# Patient Record
Sex: Male | Born: 1985 | Race: White | Hispanic: No | Marital: Single | Smoking: Current some day smoker
Health system: Southern US, Community
[De-identification: ages and names within clinical notes are randomized; demographics above are authoritative.]

## PROBLEM LIST (undated history)

## (undated) DIAGNOSIS — F1910 Other psychoactive substance abuse, uncomplicated: Secondary | ICD-10-CM

## (undated) DIAGNOSIS — F419 Anxiety disorder, unspecified: Secondary | ICD-10-CM

## (undated) DIAGNOSIS — I10 Essential (primary) hypertension: Secondary | ICD-10-CM

## (undated) HISTORY — PX: WISDOM TOOTH EXTRACTION: SHX21

## (undated) HISTORY — DX: Essential (primary) hypertension: I10

## (undated) HISTORY — DX: Anxiety disorder, unspecified: F41.9

## (undated) HISTORY — DX: Other psychoactive substance abuse, uncomplicated: F19.10

---

## 2004-08-02 ENCOUNTER — Ambulatory Visit: Payer: Self-pay | Admitting: Psychiatry

## 2010-03-24 ENCOUNTER — Emergency Department (HOSPITAL_COMMUNITY): Admission: EM | Admit: 2010-03-24 | Discharge: 2010-03-24 | Payer: Self-pay | Admitting: Emergency Medicine

## 2011-04-12 ENCOUNTER — Emergency Department (HOSPITAL_COMMUNITY)
Admission: EM | Admit: 2011-04-12 | Discharge: 2011-04-13 | Disposition: A | Discharge status: Home / Self Care | Payer: BC Managed Care – PPO | Attending: Emergency Medicine | Admitting: Emergency Medicine

## 2011-04-12 DIAGNOSIS — K089 Disorder of teeth and supporting structures, unspecified: Secondary | ICD-10-CM | POA: Insufficient documentation

## 2011-04-12 DIAGNOSIS — K047 Periapical abscess without sinus: Secondary | ICD-10-CM | POA: Insufficient documentation

## 2020-04-15 ENCOUNTER — Ambulatory Visit
Admission: EM | Admit: 2020-04-15 | Discharge: 2020-04-15 | Disposition: A | Discharge status: Home / Self Care | Payer: Self-pay

## 2020-04-15 ENCOUNTER — Other Ambulatory Visit: Payer: Self-pay

## 2020-04-15 DIAGNOSIS — H938X3 Other specified disorders of ear, bilateral: Secondary | ICD-10-CM

## 2020-04-15 DIAGNOSIS — H60502 Unspecified acute noninfective otitis externa, left ear: Secondary | ICD-10-CM

## 2020-04-15 DIAGNOSIS — H9203 Otalgia, bilateral: Secondary | ICD-10-CM

## 2020-04-15 DIAGNOSIS — H9313 Tinnitus, bilateral: Secondary | ICD-10-CM

## 2020-04-15 MED ORDER — PREDNISONE 10 MG (21) PO TBPK
ORAL_TABLET | Freq: Every day | ORAL | 0 refills | Status: DC
Start: 2020-04-15 — End: 2020-05-23

## 2020-04-15 MED ORDER — CIPROFLOXACIN-DEXAMETHASONE 0.3-0.1 % OT SUSP: 4.0000 [drp] | Freq: Two times a day (BID) | OTIC | 0 refills | Status: DC

## 2020-04-15 MED ORDER — NEOMYCIN-POLYMYXIN-HC 3.5-10000-1 OT SUSP: 4.0000 [drp] | Freq: Three times a day (TID) | OTIC | 0 refills | Status: AC

## 2020-04-15 NOTE — ED Provider Notes (Signed)
[place]   [id number] 04/15/20 Arrival Time: 1301  CC: EAR PAIN  SUBJECTIVE: History from: patient.  Christopher Castro is a 34 y.o. male who presents with of bilateral tinnitus, ear pressure, and mild pain 6 months.  Denies a precipitating event, or trauma.  Patient reports intermittent pressure, and constant ringing.  Went to UC when symptoms began, patient has tried meclizine without relief.  Symptoms are made worse in the morning.  Denies similar symptoms in the past.  Complains of associated fatigue, and nausea.  Denies fever, chills, sinus pain, rhinorrhea, ear discharge, sore throat, cough, SOB, wheezing, chest pain, nausea, changes in bowel or bladder habits.    Patient also mentions LUQ x 7 months.  Recently went to ED and had full work-up done.  Blood work and CT were negative.  Patient instructed to follow up with PCP, has not done so yet.  Would like to be seen for ear symptoms.    ROS: As per HPI.  All other pertinent ROS negative.     History reviewed. No pertinent past medical history. History reviewed. No pertinent surgical history. No Known Allergies No current facility-administered medications on file prior to encounter.   Current Outpatient Medications on File Prior to Encounter  Medication Sig Dispense Refill  . buprenorphine (SUBUTEX) 2 MG SUBL SL tablet Place 8 mg under the tongue daily.     Social History   Socioeconomic History  . Marital status: Single    Spouse name: Not on file  . Number of children: Not on file  . Years of education: Not on file  . Highest education level: Not on file  Occupational History  . Not on file  Tobacco Use  . Smoking status: Current Every Day Smoker    Packs/day: 0.50    Types: Cigarettes  . Smokeless tobacco: Never Used  Substance and Sexual Activity  . Alcohol use: Not Currently  . Drug use: Not Currently  . Sexual activity: Not on file  Other Topics Concern  . Not on file  Social History Narrative  .  Not on file   Social Determinants of Health   Financial Resource Strain:   . Difficulty of Paying Living Expenses:   Food Insecurity:   . Worried About Charity fundraiser in the Last Year:   . Arboriculturist in the Last Year:   Transportation Needs:   . Film/video editor (Medical):   Marland Kitchen Lack of Transportation (Non-Medical):   Physical Activity:   . Days of Exercise per Week:   . Minutes of Exercise per Session:   Stress:   . Feeling of Stress :   Social Connections:   . Frequency of Communication with Friends and Family:   . Frequency of Social Gatherings with Friends and Family:   . Attends Religious Services:   . Active Member of Clubs or Organizations:   . Attends Archivist Meetings:   Marland Kitchen Marital Status:   Intimate Partner Violence:   . Fear of Current or Ex-Partner:   . Emotionally Abused:   Marland Kitchen Physically Abused:   . Sexually Abused:    History reviewed. No pertinent family history.  OBJECTIVE:  Vitals:   04/15/20 1317  BP: (!) 143/89  Pulse: 73  Resp: 18  Temp: 98.6 F (37 C)  SpO2: 96%     General appearance: alert; well-appearing, nontoxic; speaking in full sentences and tolerating own secretions HEENT: NCAT; Ears: EAC clear, LT EAC mildly erythematous, TMs pearly  gray; Eyes: PERRL.  EOM grossly intact.Nose: nares patent without rhinorrhea, Throat: oropharynx clear, tonsils non erythematous or enlarged, uvula midline  Neck: supple without LAD Lungs: unlabored respirations, symmetrical air entry; cough: absent; no respiratory distress; CTAB Heart: regular rate and rhythm.  Skin: warm and dry Psychological: alert and cooperative; normal mood and affect   ASSESSMENT & PLAN:  1. Ringing in ear, bilateral   2. Pressure sensation in both ears   3. Ear discomfort, bilateral   4. Acute otitis externa of left ear, unspecified type     Meds ordered this encounter  Medications  . ciprofloxacin-dexamethasone (CIPRODEX) OTIC suspension    Sig:  Place 4 drops into the left ear 2 (two) times daily for 7 days.    Dispense:  7.5 mL    Refill:  0    Order Specific Question:   Supervising Provider    Answer:   Eustace Moore [[id number]]  . predniSONE (STERAPRED UNI-PAK 21 TAB) 10 MG (21) TBPK tablet    Sig: Take by mouth daily. Take 6 tabs by mouth daily  for 2 days, then 5 tabs for 2 days, then 4 tabs for 2 days, then 3 tabs for 2 days, 2 tabs for 2 days, then 1 tab by mouth daily for 2 days    Dispense:  42 tablet    Refill:  0    Order Specific Question:   Supervising Provider    Answer:   Eustace Moore [[id number]]    Rest and drink plenty of fluids Ciprodex ear drops prescribed.  Take as directed and to completion We will trial a course of steroids as well for the ringing in your ears Follow up with PCP for further evaluation and management Return here or go to the ER if you have any new or worsening symptoms fever, chills, nausea, vomiting, redness, swelling, discharge, pain, worsening symptoms despite treatment, etc...  Reviewed expectations re: course of current medical issues. Questions answered. Outlined signs and symptoms indicating need for more acute intervention. Patient verbalized understanding. After Visit Summary given.         Rennis Harding, PA-C 04/15/20 1521

## 2020-04-15 NOTE — Discharge Instructions (Addendum)
Rest and drink plenty of fluids Ciprodex ear drops prescribed.  Take as directed and to completion We will trial a course of steroids as well for the ringing in your ears Follow up with PCP for further evaluation and management Return here or go to the ER if you have any new or worsening symptoms fever, chills, nausea, vomiting, redness, swelling, discharge, pain, worsening symptoms despite treatment, etc..Marland Kitchen

## 2020-04-15 NOTE — ED Triage Notes (Signed)
Pt presents with c/o ear pressure and left upper quadrant pian. Pt states that last fall he developed dizziness and ears popping since then they have been ringing then 2 days ago pressure developed also has left upper quadrant pain and had CT for same last month and was negative but pain has increased

## 2020-05-16 ENCOUNTER — Other Ambulatory Visit: Payer: Self-pay

## 2020-05-16 DIAGNOSIS — Z139 Encounter for screening, unspecified: Secondary | ICD-10-CM

## 2020-05-16 LAB — GLUCOSE, POCT (MANUAL RESULT ENTRY): POC Glucose: 95 mg/dl (ref 70–99)

## 2020-05-16 NOTE — Congregational Nurse Program (Signed)
Pt attended [facility] to get established with a primary care provider and to to complete his enrollment for [facility] uninsured program.  Pt states his chief complaints are ringing in ears, intermittent headaches during morning hours, fatigue that has been on going since Nov 2020.   States he has been tested for covid 4 times, and symptoms have continue before and after testing.  States he has has intermittent pain in upper left abdomen area since Aug/Sept 2020.   States he recently went to ER for same symptoms described earlier, but they have not yet subsided.  No socio-determinant needs identified during visit outside of medical needs.    State he has a former substance abuse issue with opoids, but denies using in over 1 year or longer.  States [facility], where he obtain his med  (Subutex 8mg  daily mouth) on a weekly basis to help with the opiod dependency     Vital signs were checked (see vitals on record)  BP (ABN), Blood Glucose(random) an denies any previous hx or hyp (NORM)/Random).  States previous hx of elevated blood pressure but no prescribed meds.   BP/Smoking prevention/mangement was reviewed, in addition to guidelines of uninsured  program and when to use appropriate medical facilities based on medical needs.      Plan -Referred to [facility] Program for enrollment and eligibilty for uninsured..  Enrollment was completed by ) during visit  -Referral made on today for [facility]  and appointment was made for Tuesday, May 23, 2020 at 9:30am.

## 2020-05-23 ENCOUNTER — Other Ambulatory Visit: Payer: Self-pay

## 2020-05-23 ENCOUNTER — Telehealth: Payer: Self-pay | Admitting: Student

## 2020-05-23 ENCOUNTER — Ambulatory Visit: Payer: Self-pay | Admitting: Physician Assistant

## 2020-05-23 ENCOUNTER — Encounter: Payer: Self-pay | Admitting: Physician Assistant

## 2020-05-23 VITALS — BP 130/80 | HR 94 | Temp 97.7°F | Ht 72.0 in | Wt 214.5 lb

## 2020-05-23 DIAGNOSIS — R1012 Left upper quadrant pain: Secondary | ICD-10-CM

## 2020-05-23 DIAGNOSIS — R519 Headache, unspecified: Secondary | ICD-10-CM

## 2020-05-23 DIAGNOSIS — Z7689 Persons encountering health services in other specified circumstances: Secondary | ICD-10-CM

## 2020-05-23 DIAGNOSIS — J329 Chronic sinusitis, unspecified: Secondary | ICD-10-CM

## 2020-05-23 DIAGNOSIS — F1911 Other psychoactive substance abuse, in remission: Secondary | ICD-10-CM

## 2020-05-23 DIAGNOSIS — H9313 Tinnitus, bilateral: Secondary | ICD-10-CM

## 2020-05-23 DIAGNOSIS — Z131 Encounter for screening for diabetes mellitus: Secondary | ICD-10-CM

## 2020-05-23 DIAGNOSIS — Z13220 Encounter for screening for lipoid disorders: Secondary | ICD-10-CM

## 2020-05-23 MED ORDER — OMEPRAZOLE 40 MG PO CPDR: 40.0000 mg | DELAYED_RELEASE_CAPSULE | Freq: Every day | ORAL | 3 refills | Status: DC

## 2020-05-23 MED ORDER — AMOXICILLIN-POT CLAVULANATE 875-125 MG PO TABS
1.0000 | ORAL_TABLET | Freq: Two times a day (BID) | ORAL | 0 refills | Status: AC
Start: 2020-05-23 — End: 2020-06-06

## 2020-05-23 NOTE — Patient Instructions (Signed)
Gastroesophageal Reflux Disease, Adult Gastroesophageal reflux (GER) happens when acid from the stomach flows up into the tube that connects the mouth and the stomach (esophagus). Normally, food travels down the esophagus and stays in the stomach to be digested. However, when a person has GER, food and stomach acid sometimes move back up into the esophagus. If this becomes a more serious problem, the person may be diagnosed with a disease called gastroesophageal reflux disease (GERD). GERD occurs when the reflux:  Happens often.  Causes frequent or severe symptoms.  Causes problems such as damage to the esophagus. When stomach acid comes in contact with the esophagus, the acid may cause soreness (inflammation) in the esophagus. Over time, GERD may create small holes (ulcers) in the lining of the esophagus. What are the causes? This condition is caused by a problem with the muscle between the esophagus and the stomach (lower esophageal sphincter, or LES). Normally, the LES muscle closes after food passes through the esophagus to the stomach. When the LES is weakened or abnormal, it does not close properly, and that allows food and stomach acid to go back up into the esophagus. The LES can be weakened by certain dietary substances, medicines, and medical conditions, including:  Tobacco use.  Pregnancy.  Having a hiatal hernia.  Alcohol use.  Certain foods and beverages, such as coffee, chocolate, onions, and peppermint. What increases the risk? You are more likely to develop this condition if you:  Have an increased body weight.  Have a connective tissue disorder.  Use NSAID medicines. What are the signs or symptoms? Symptoms of this condition include:  Heartburn.  Difficult or painful swallowing.  The feeling of having a lump in the throat.  Abitter taste in the mouth.  Bad breath.  Having a large amount of saliva.  Having an upset or bloated  stomach.  Belching.  Chest pain. Different conditions can cause chest pain. Make sure you see your health care provider if you experience chest pain.  Shortness of breath or wheezing.  Ongoing (chronic) cough or a night-time cough.  Wearing away of tooth enamel.  Weight loss. How is this diagnosed? Your health care provider will take a medical history and perform a physical exam. To determine if you have mild or severe GERD, your health care provider may also monitor how you respond to treatment. You may also have tests, including:  A test to examine your stomach and esophagus with a small camera (endoscopy).  A test thatmeasures the acidity level in your esophagus.  A test thatmeasures how much pressure is on your esophagus.  A barium swallow or modified barium swallow test to show the shape, size, and functioning of your esophagus. How is this treated? The goal of treatment is to help relieve your symptoms and to prevent complications. Treatment for this condition may vary depending on how severe your symptoms are. Your health care provider may recommend:  Changes to your diet.  Medicine.  Surgery. Follow these instructions at home: Eating and drinking   Follow a diet as recommended by your health care provider. This may involve avoiding foods and drinks such as: ? Coffee and tea (with or without caffeine). ? Drinks that containalcohol. ? Energy drinks and sports drinks. ? Carbonated drinks or sodas. ? Chocolate and cocoa. ? Peppermint and mint flavorings. ? Garlic and onions. ? Horseradish. ? Spicy and acidic foods, including peppers, chili powder, curry powder, vinegar, hot sauces, and barbecue sauce. ? Citrus fruit juices and citrus   fruits, such as oranges, lemons, and limes. ? Tomato-based foods, such as red sauce, chili, salsa, and pizza with red sauce. ? Fried and fatty foods, such as donuts, french fries, potato chips, and high-fat dressings. ? High-fat  meats, such as hot dogs and fatty cuts of red and white meats, such as rib eye steak, sausage, ham, and bacon. ? High-fat dairy items, such as whole milk, butter, and cream cheese.  Eat small, frequent meals instead of large meals.  Avoid drinking large amounts of liquid with your meals.  Avoid eating meals during the 2-3 hours before bedtime.  Avoid lying down right after you eat.  Do not exercise right after you eat. Lifestyle   Do not use any products that contain nicotine or tobacco, such as cigarettes, e-cigarettes, and chewing tobacco. If you need help quitting, ask your health care provider.  Try to reduce your stress by using methods such as yoga or meditation. If you need help reducing stress, ask your health care provider.  If you are overweight, reduce your weight to an amount that is healthy for you. Ask your health care provider for guidance about a safe weight loss goal. General instructions  Pay attention to any changes in your symptoms.  Take over-the-counter and prescription medicines only as told by your health care provider. Do not take aspirin, ibuprofen, or other NSAIDs unless your health care provider told you to do so.  Wear loose-fitting clothing. Do not wear anything tight around your waist that causes pressure on your abdomen.  Raise (elevate) the head of your bed about 6 inches (15 cm).  Avoid bending over if this makes your symptoms worse.  Keep all follow-up visits as told by your health care provider. This is important. Contact a health care provider if:  You have: ? New symptoms. ? Unexplained weight loss. ? Difficulty swallowing or it hurts to swallow. ? Wheezing or a persistent cough. ? A hoarse voice.  Your symptoms do not improve with treatment. Get help right away if you:  Have pain in your arms, neck, jaw, teeth, or back.  Feel sweaty, dizzy, or light-headed.  Have chest pain or shortness of breath.  Vomit and your vomit looks  like blood or coffee grounds.  Faint.  Have stool that is bloody or black.  Cannot swallow, drink, or eat. Summary  Gastroesophageal reflux happens when acid from the stomach flows up into the esophagus. GERD is a disease in which the reflux happens often, causes frequent or severe symptoms, or causes problems such as damage to the esophagus.  Treatment for this condition may vary depending on how severe your symptoms are. Your health care provider may recommend diet and lifestyle changes, medicine, or surgery.  Contact a health care provider if you have new or worsening symptoms.  Take over-the-counter and prescription medicines only as told by your health care provider. Do not take aspirin, ibuprofen, or other NSAIDs unless your health care provider told you to do so.  Keep all follow-up visits as told by your health care provider. This is important. This information is not intended to replace advice given to you by your health care provider. Make sure you discuss any questions you have with your health care provider. Document Revised: 05/27/2018 Document Reviewed: 05/27/2018 Elsevier Patient Education  2020 Elsevier Inc.  

## 2020-05-23 NOTE — Telephone Encounter (Signed)
Pt called c/o nausea and vomiting after having taken his new rx medications augmentin and omeprazole. Pt states he took augmentin first and then became nauseous. Pt then took omeprazole and began vomiting. Pt reports having two vomiting episodes. Pt then while still on the phone with LPN began gagging and heaving (possibly vomiting). LPN advises pt to seek ER treatment as it may be an allergic reaction to the medications. Pt verbalized understanding.

## 2020-05-23 NOTE — Progress Notes (Signed)
BP 130/80   Pulse 94   Temp 97.7 F (36.5 C)   Ht 6' (1.829 m)   Wt 214 lb 8 oz (97.3 kg)   SpO2 96%   BMI 29.09 kg/m    Subjective:    Patient ID: Christopher Castro, male    DOB: Apr 14, 1986, 34 y.o.   MRN: [id number]  HPI: Harbert Fitterer is a 34 y.o. male presenting on 05/23/2020 for New Patient (Initial Visit) (no previous PCP), Tinnitus (with HA. pt believes its both ear which started Nov. 2020 after he was sick pt states he hear constant popping in his ears and ever since has had ringing in his ears. pt states he has had intermittent HA daily), and Abdominal Pain (since August/ Sept. 2020.)   HPI   Pt had a negative covid 19 screening questionnaire.   Pt is a 57yoM who presents to establish care.  He has had No PCP in a long time/years.  Pt is still taking subutex from [facility] for hx substance abuse.   He says he has been clean for years.    Pt works as a Oceanographer, mostly Equities trader.  Pt says he feels really really bad.  Some days headache pain.    He has not gottten the covid vaccination.    He has been Feeling bad since before christmas - waxing and waning but never going away.   Lots of head congestion comes and goes.  Head pain frontal .  Ears only sometimes hurt but just a little.  Ringing never goes away.  No dizziness.   abd pain started end of last summer.  Comes and goes.  LUQ.  No better or worse with eating.  No OTCs except APAP and IBU which he takes sometimes for HA and sometimes for abd pain.   He will usually take 1 apap and 2-4 IBU at one time-  Most days none-   Some days he takes multiple doses.  mOre for the head pain than the abd pain.   No emesis.  BMs normal with no blood.    No family history of colon cancer.    He had a abdominal scan of some type at [facility] ER but he says it was unhelpful.  He has been 2 or 3 times.  Most recent time was for abd pain when he had the scan of abd.  He says he never had scan of Head.   He says he was never  given medication to help his stomach pain.      Relevant past medical, surgical, family and social history reviewed and updated as indicated. Interim medical history since our last visit reviewed. Allergies and medications reviewed and updated.   Current Outpatient Medications:  .  Acetaminophen (TYLENOL PO), Take 1 tablet by mouth as needed., Disp: , Rfl:  .  buprenorphine (SUBUTEX) 8 MG SUBL SL tablet, Place under the tongue daily., Disp: , Rfl:  .  Ibuprofen (ADVIL PO), Take 1 tablet by mouth as needed., Disp: , Rfl:      Review of Systems  Per HPI unless specifically indicated above     Objective:    BP 130/80   Pulse 94   Temp 97.7 F (36.5 C)   Ht 6' (1.829 m)   Wt 214 lb 8 oz (97.3 kg)   SpO2 96%   BMI 29.09 kg/m   Wt Readings from Last 3 Encounters:  05/23/20 214 lb 8 oz (97.3 kg)  05/16/20 213 lb (96.6  kg)    Physical Exam Vitals reviewed.  Constitutional:      General: He is not in acute distress.    Appearance: He is well-developed.  HENT:     Head: Normocephalic and atraumatic.     Right Ear: Tympanic membrane and ear canal normal.     Left Ear: Tympanic membrane and ear canal normal.     Nose: Congestion and rhinorrhea present.     Mouth/Throat:     Pharynx: No oropharyngeal exudate or posterior oropharyngeal erythema.  Eyes:     Conjunctiva/sclera: Conjunctivae normal.     Pupils: Pupils are equal, round, and reactive to light.  Neck:     Thyroid: No thyromegaly.  Cardiovascular:     Rate and Rhythm: Normal rate and regular rhythm.  Pulmonary:     Effort: Pulmonary effort is normal.     Breath sounds: Normal breath sounds. No wheezing or rales.  Abdominal:     General: Bowel sounds are normal.     Palpations: Abdomen is soft. There is no mass.     Tenderness: There is abdominal tenderness in the epigastric area. There is no guarding or rebound.     Comments: Mild epigastric tenderness.  Genitourinary:    Testes:        Right: Swelling  not present.        Left: Swelling not present.  Musculoskeletal:     Cervical back: Neck supple.     Right lower leg: No edema.     Left lower leg: No edema.  Lymphadenopathy:     Cervical: No cervical adenopathy.  Skin:    General: Skin is warm and dry.     Findings: No rash.  Neurological:     Mental Status: He is alert and oriented to person, place, and time.     Cranial Nerves: No facial asymmetry.     Sensory: Sensation is intact.     Motor: No weakness.     Gait: Gait is intact. Gait normal.     Deep Tendon Reflexes:     Reflex Scores:      Patellar reflexes are 2+ on the right side and 2+ on the left side. Psychiatric:        Behavior: Behavior normal.             Assessment & Plan:    Encounter Diagnoses  Name Primary?  . Encounter to establish care Yes  . Nonintractable headache, unspecified chronicity pattern, unspecified headache type   . Left upper quadrant abdominal pain   . Sinusitis, unspecified chronicity, unspecified location   . Tinnitus of both ears   . Screening cholesterol level   . Screening for diabetes mellitus   . History of substance abuse (HCC)      -will check baseline Labs -rx augmentin for suspected sinusitis.  If HA persists, will get CT and if unrevealing will refer to ENT.  Discussed plan with pt who is in agreement. -encouraged Pt to get covid vaccination -pt counseled to Discontinue IBU and APAP (due to tinnitus and abdominal pain) -rx omeprazole and counseled on lifestyle changes.  Gave GERD reading information -encouraged smoking cessation.  Discussed with pt ways this may be worsening his HA and his abdominal pain -records of abdominal CT requested from [facility] but had not arrived while pt in office. -pt to follow up 3 weeks to recheck symptoms and review labs.  He is to contact office sooner for any worsening or new symptoms.

## 2020-05-24 ENCOUNTER — Telehealth: Payer: Self-pay | Admitting: Physician Assistant

## 2020-05-24 ENCOUNTER — Other Ambulatory Visit: Payer: Self-pay | Admitting: Physician Assistant

## 2020-05-24 ENCOUNTER — Encounter: Payer: Self-pay | Admitting: Physician Assistant

## 2020-05-24 MED ORDER — LEVOFLOXACIN 750 MG PO TABS
750.0000 mg | ORAL_TABLET | Freq: Every day | ORAL | 0 refills | Status: DC
Start: 2020-05-24 — End: 2020-06-13

## 2020-05-24 MED ORDER — PROMETHAZINE HCL 25 MG PO TABS
25.0000 mg | ORAL_TABLET | Freq: Three times a day (TID) | ORAL | 0 refills | Status: DC | PRN
Start: 2020-05-24 — End: 2020-06-13

## 2020-05-24 NOTE — Telephone Encounter (Signed)
Called pt to check on him after his call to the office yesterday with vomiting.  He went to [facility] ER and was given IVF and antiemetics.  He says he is still a little nauseous today but much better.  He says he is tired.  He says ER gave him rx for zofran and keflex which he has not yet picked up from the pharmacy.  Discussed with pt that in light of his lengthy symptoms, he needs more powerful antibiotic (which is why he was given augmentin).  Pt is counseled to not get his keflex and instead rx for levaquin will be sent to [facility].  Also sent rx for phenergan since zofran is expensive.  Pt is cautioned to avoid driving on phenergan.  He is to follow up July 13 as scheduled.  He is to contact office sooner prn.  Discussed reaction to augmentin when he has done well with amoxil in the recent past.  Discussed it is augmentin and not likely any PCN allergy.   He states understanding.

## 2020-05-31 ENCOUNTER — Other Ambulatory Visit: Payer: Self-pay

## 2020-05-31 ENCOUNTER — Other Ambulatory Visit (HOSPITAL_COMMUNITY)
Admission: RE | Admit: 2020-05-31 | Discharge: 2020-05-31 | Disposition: A | Discharge status: Home / Self Care | Payer: Self-pay | Source: Ambulatory Visit | Attending: Physician Assistant | Admitting: Physician Assistant

## 2020-05-31 DIAGNOSIS — Z131 Encounter for screening for diabetes mellitus: Secondary | ICD-10-CM | POA: Insufficient documentation

## 2020-05-31 DIAGNOSIS — R519 Headache, unspecified: Secondary | ICD-10-CM | POA: Insufficient documentation

## 2020-05-31 DIAGNOSIS — Z13220 Encounter for screening for lipoid disorders: Secondary | ICD-10-CM | POA: Insufficient documentation

## 2020-05-31 DIAGNOSIS — J329 Chronic sinusitis, unspecified: Secondary | ICD-10-CM | POA: Insufficient documentation

## 2020-05-31 LAB — COMPREHENSIVE METABOLIC PANEL
ALT: 31 U/L (ref 0–44)
AST: 19 U/L (ref 15–41)
Albumin: 3.7 g/dL (ref 3.5–5.0)
Alkaline Phosphatase: 50 U/L (ref 38–126)
Anion gap: 9 (ref 5–15)
BUN: 11 mg/dL (ref 6–20)
CO2: 27 mmol/L (ref 22–32)
Calcium: 8.9 mg/dL (ref 8.9–10.3)
Chloride: 102 mmol/L (ref 98–111)
Creatinine, Ser: 0.86 mg/dL (ref 0.61–1.24)
GFR calc Af Amer: >60 mL/min (ref >60)
GFR calc non Af Amer: >60 mL/min (ref >60)
Glucose, Bld: 102 mg/dL — ABNORMAL HIGH (ref 70–99)
Potassium: 3.9 mmol/L (ref 3.5–5.1)
Sodium: 138 mmol/L (ref 135–145)
Total Bilirubin: 0.5 mg/dL (ref 0.3–1.2)
Total Protein: 6.6 g/dL (ref 6.5–8.1)

## 2020-05-31 LAB — CBC WITH DIFFERENTIAL/PLATELET
Abs Immature Granulocytes: 0.04 10*3/uL (ref 0.00–0.07)
Basophils Absolute: 0.1 10*3/uL (ref 0.0–0.1)
Basophils Relative: 2 %
Eosinophils Absolute: 0.3 10*3/uL (ref 0.0–0.5)
Eosinophils Relative: 5 %
HCT: 42.4 % (ref 39.0–52.0)
Hemoglobin: 14.1 g/dL (ref 13.0–17.0)
Immature Granulocytes: 1 %
Lymphocytes Relative: 30 %
Lymphs Abs: 1.8 10*3/uL (ref 0.7–4.0)
MCH: 29.6 pg (ref 26.0–34.0)
MCHC: 33.3 g/dL (ref 30.0–36.0)
MCV: 88.9 fL (ref 80.0–100.0)
Monocytes Absolute: 0.6 10*3/uL (ref 0.1–1.0)
Monocytes Relative: 10 %
Neutro Abs: 3.3 10*3/uL (ref 1.7–7.7)
Neutrophils Relative %: 52 %
Platelets: 296 10*3/uL (ref 150–400)
RBC: 4.77 MIL/uL (ref 4.22–5.81)
RDW: 12.2 % (ref 11.5–15.5)
WBC: 6.2 10*3/uL (ref 4.0–10.5)
nRBC: 0 % (ref 0.0–0.2)

## 2020-05-31 LAB — LIPID PANEL
Cholesterol: 198 mg/dL (ref 0–200)
HDL: 35 mg/dL — ABNORMAL LOW (ref >40)
LDL Cholesterol: 139 mg/dL — ABNORMAL HIGH (ref 0–99)
Total CHOL/HDL Ratio: 5.7 RATIO
Triglycerides: 120 mg/dL (ref <150)
VLDL: 24 mg/dL (ref 0–40)

## 2020-06-01 ENCOUNTER — Encounter: Payer: Self-pay | Admitting: Physician Assistant

## 2020-06-01 LAB — HEMOGLOBIN A1C
Hgb A1c MFr Bld: 5.5 % (ref 4.8–5.6)
Mean Plasma Glucose: 111 mg/dL

## 2020-06-13 ENCOUNTER — Other Ambulatory Visit: Payer: Self-pay

## 2020-06-13 ENCOUNTER — Encounter: Payer: Self-pay | Admitting: Physician Assistant

## 2020-06-13 ENCOUNTER — Ambulatory Visit: Payer: Self-pay | Admitting: Physician Assistant

## 2020-06-13 VITALS — BP 130/86 | HR 104 | Temp 97.5°F | Ht 72.0 in | Wt 219.8 lb

## 2020-06-13 DIAGNOSIS — R519 Headache, unspecified: Secondary | ICD-10-CM

## 2020-06-13 DIAGNOSIS — H9313 Tinnitus, bilateral: Secondary | ICD-10-CM

## 2020-06-13 DIAGNOSIS — F17200 Nicotine dependence, unspecified, uncomplicated: Secondary | ICD-10-CM

## 2020-06-13 DIAGNOSIS — F1911 Other psychoactive substance abuse, in remission: Secondary | ICD-10-CM

## 2020-06-13 DIAGNOSIS — E785 Hyperlipidemia, unspecified: Secondary | ICD-10-CM

## 2020-06-13 DIAGNOSIS — R109 Unspecified abdominal pain: Secondary | ICD-10-CM

## 2020-06-13 NOTE — Progress Notes (Signed)
BP 130/86   Pulse (!) 104   Temp (!) 97.5 F (36.4 C)   Ht 6' (1.829 m)   Wt 219 lb 12 oz (99.7 kg)   SpO2 97%   BMI 29.80 kg/m    Subjective:    Patient ID: Christopher Castro, male    DOB: Mar 23, 1986, 34 y.o.   MRN: [id number]  HPI: Christopher Castro is a 34 y.o. male presenting on 06/13/2020 for Tinnitus, Headache, and Abdominal Pain   HPI  Pt had a negative covid 19 screening questionnaire.   pt is 33yoM who established care last month.   Pt has tinnitis which he says has not changed.  Pt took a full course of levaquin for HA suspected from sinusitis but pt states HA is unchanged.  (pt was originally given Rx for augmentin but had significant GI side effects so was changed to levaquin)  He has still not gotten covid vaccination  Pt has been taking his omeprazole but says his abdominal pain isn't any better either.  He says it comes and goes but that it has always been that way.    He is still smoking      Relevant past medical, surgical, family and social history reviewed and updated as indicated. Interim medical history since our last visit reviewed. Allergies and medications reviewed and updated.    Current Outpatient Medications:  .  buprenorphine (SUBUTEX) 8 MG SUBL SL tablet, Place under the tongue daily., Disp: , Rfl:  .  omeprazole (PRILOSEC) 40 MG capsule, Take 1 capsule (40 mg total) by mouth daily., Disp: 30 capsule, Rfl: 3    Review of Systems  Per HPI unless specifically indicated above     Objective:    BP 130/86   Pulse (!) 104   Temp (!) 97.5 F (36.4 C)   Ht 6' (1.829 m)   Wt 219 lb 12 oz (99.7 kg)   SpO2 97%   BMI 29.80 kg/m   Wt Readings from Last 3 Encounters:  06/13/20 219 lb 12 oz (99.7 kg)  05/23/20 214 lb 8 oz (97.3 kg)  05/16/20 213 lb (96.6 kg)    Physical Exam Vitals reviewed.  Constitutional:      General: He is not in acute distress.    Appearance: He is well-developed. He is not toxic-appearing.  HENT:     Head:  Normocephalic and atraumatic.     Right Ear: Tympanic membrane normal.     Left Ear: Tympanic membrane normal.     Nose: Congestion present.  Cardiovascular:     Rate and Rhythm: Normal rate and regular rhythm.  Pulmonary:     Effort: Pulmonary effort is normal.     Breath sounds: Normal breath sounds. No wheezing.  Abdominal:     General: Bowel sounds are normal.     Palpations: Abdomen is soft. There is no mass or pulsatile mass.     Tenderness: There is no abdominal tenderness. There is no guarding or rebound.  Musculoskeletal:     Cervical back: Neck supple.     Right lower leg: No edema.     Left lower leg: No edema.  Lymphadenopathy:     Cervical: No cervical adenopathy.  Skin:    General: Skin is warm and dry.  Neurological:     Mental Status: He is alert and oriented to person, place, and time.  Psychiatric:        Behavior: Behavior normal.     Results for orders placed or  performed during the hospital encounter of 05/31/20  Hemoglobin A1c  Result Value Ref Range   Hgb A1c MFr Bld 5.5 4.8 - 5.6 %   Mean Plasma Glucose 111 mg/dL  CBC w/Diff/Platelet  Result Value Ref Range   WBC 6.2 4.0 - 10.5 K/uL   RBC 4.77 4.22 - 5.81 MIL/uL   Hemoglobin 14.1 13.0 - 17.0 g/dL   HCT 41.6 39 - 52 %   MCV 88.9 80.0 - 100.0 fL   MCH 29.6 26.0 - 34.0 pg   MCHC 33.3 30.0 - 36.0 g/dL   RDW 60.6 30.1 - 60.1 %   Platelets 296 150 - 400 K/uL   nRBC 0.0 0.0 - 0.2 %   Neutrophils Relative % 52 %   Neutro Abs 3.3 1.7 - 7.7 K/uL   Lymphocytes Relative 30 %   Lymphs Abs 1.8 0.7 - 4.0 K/uL   Monocytes Relative 10 %   Monocytes Absolute 0.6 0 - 1 K/uL   Eosinophils Relative 5 %   Eosinophils Absolute 0.3 0 - 0 K/uL   Basophils Relative 2 %   Basophils Absolute 0.1 0 - 0 K/uL   Immature Granulocytes 1 %   Abs Immature Granulocytes 0.04 0.00 - 0.07 K/uL  Lipid panel  Result Value Ref Range   Cholesterol 198 0 - 200 mg/dL   Triglycerides 093 <235 mg/dL   HDL 35 (L) >57 mg/dL    Total CHOL/HDL Ratio 5.7 RATIO   VLDL 24 0 - 40 mg/dL   LDL Cholesterol 322 (H) 0 - 99 mg/dL  Comprehensive metabolic panel  Result Value Ref Range   Sodium 138 135 - 145 mmol/L   Potassium 3.9 3.5 - 5.1 mmol/L   Chloride 102 98 - 111 mmol/L   CO2 27 22 - 32 mmol/L   Glucose, Bld 102 (H) 70 - 99 mg/dL   BUN 11 6 - 20 mg/dL   Creatinine, Ser 0.25 0.61 - 1.24 mg/dL   Calcium 8.9 8.9 - 42.7 mg/dL   Total Protein 6.6 6.5 - 8.1 g/dL   Albumin 3.7 3.5 - 5.0 g/dL   AST 19 15 - 41 U/L   ALT 31 0 - 44 U/L   Alkaline Phosphatase 50 38 - 126 U/L   Total Bilirubin 0.5 0.3 - 1.2 mg/dL   GFR calc non Af Amer >60 >60 mL/min   GFR calc Af Amer >60 >60 mL/min   Anion gap 9 5 - 15      Assessment & Plan:   Encounter Diagnoses  Name Primary?  Marland Kitchen Nonintractable headache, unspecified chronicity pattern, unspecified headache type Yes  . Tinnitus of both ears   . Tobacco use disorder   . Abdominal pain, unspecified abdominal location   . History of substance abuse (HCC)   . Hyperlipidemia, unspecified hyperlipidemia type      -reviewed labs with pt -will get CT Scan head.  If unrevealing, will refer to ENT -pt is given CAFA/ application for [employer] financial assistance -pt encouraged to Get covid vaccination -pt to Continue omeprazole.   -counseled pt on dyslipidemia and encouraged lowfat diet and regular exercise.  Pt is given reading information -pt to follow up 6 weeks.  He is to contact office sooner prn worsening or new symptoms

## 2020-06-13 NOTE — Patient Instructions (Signed)

## 2020-06-23 ENCOUNTER — Other Ambulatory Visit: Payer: Self-pay

## 2020-06-23 ENCOUNTER — Ambulatory Visit (HOSPITAL_COMMUNITY)
Admission: RE | Admit: 2020-06-23 | Discharge: 2020-06-23 | Disposition: A | Discharge status: Home / Self Care | Payer: Self-pay | Source: Ambulatory Visit | Attending: Physician Assistant | Admitting: Physician Assistant

## 2020-06-23 DIAGNOSIS — R519 Headache, unspecified: Secondary | ICD-10-CM | POA: Insufficient documentation

## 2020-07-11 ENCOUNTER — Other Ambulatory Visit: Payer: Self-pay | Admitting: Physician Assistant

## 2020-07-11 MED ORDER — DICLOFENAC SODIUM 75 MG PO TBEC: 75.0000 mg | DELAYED_RELEASE_TABLET | Freq: Two times a day (BID) | ORAL | 0 refills | Status: DC | PRN

## 2020-07-25 ENCOUNTER — Encounter: Payer: Self-pay | Admitting: Physician Assistant

## 2020-07-25 ENCOUNTER — Ambulatory Visit: Payer: Self-pay | Admitting: Physician Assistant

## 2020-07-25 DIAGNOSIS — R109 Unspecified abdominal pain: Secondary | ICD-10-CM

## 2020-07-25 DIAGNOSIS — R6889 Other general symptoms and signs: Secondary | ICD-10-CM

## 2020-07-25 DIAGNOSIS — R519 Headache, unspecified: Secondary | ICD-10-CM

## 2020-07-25 DIAGNOSIS — H9313 Tinnitus, bilateral: Secondary | ICD-10-CM

## 2020-07-25 DIAGNOSIS — R0602 Shortness of breath: Secondary | ICD-10-CM

## 2020-07-25 DIAGNOSIS — J329 Chronic sinusitis, unspecified: Secondary | ICD-10-CM

## 2020-07-25 NOTE — Progress Notes (Signed)
There were no vitals taken for this visit.   Subjective:    Patient ID: Christopher Castro, male    DOB: December 09, 1985, 34 y.o.   MRN: [id number]  HPI: Christopher Castro is a 34 y.o. male presenting on 07/25/2020 for No chief complaint on file.   HPI   Pt was scheduled for in-office appointment today but during screening, Pt reports feeling SOB so he was changed to virtual appointment through [employer].  I connected with  [name] on 07/25/20 by a video enabled telemedicine application and verified that I am speaking with the correct person using two identifiers.   I discussed the limitations of evaluation and management by telemedicine. The patient expressed understanding and agreed to proceed.  Pt is sitting outside in front of office.  Provider is inside office.     Pt has not gotten the covid vaccination.  He has appointment tomorrow  With ENT for chronic sinusitis with HA.  Pt reports that he Had flu symptoms for about 2 weeks.  He found some old prednisone laying around the house that he took.  He doesn't know how much but he finished it.  He says he has been having the feeling of SOB for just a few days.  He quit smoking 2 weeks ago.    Pt says his stomach seems to be bothering him more now.      Relevant past medical, surgical, family and social history reviewed and updated as indicated. Interim medical history since our last visit reviewed. Allergies and medications reviewed and updated.    Current Outpatient Medications:    buprenorphine (SUBUTEX) 8 MG SUBL SL tablet, Place under the tongue daily., Disp: , Rfl:    diclofenac (VOLTAREN) 75 MG EC tablet, Take 1 tablet (75 mg total) by mouth 2 (two) times daily as needed., Disp: 30 tablet, Rfl: 0   omeprazole (PRILOSEC) 40 MG capsule, Take 1 capsule (40 mg total) by mouth daily., Disp: 30 capsule, Rfl: 3       Review of Systems  Per HPI unless specifically indicated above     Objective:    There were no vitals  taken for this visit.  Wt Readings from Last 3 Encounters:  06/13/20 219 lb 12 oz (99.7 kg)  05/23/20 214 lb 8 oz (97.3 kg)  05/16/20 213 lb (96.6 kg)    Physical Exam Constitutional:      General: He is not in acute distress.    Appearance: He is not ill-appearing.  HENT:     Head: Normocephalic and atraumatic.  Pulmonary:     Effort: No respiratory distress.     Comments: Pt appears to not have any SOB and is not tachypnic Neurological:     Mental Status: He is alert and oriented to person, place, and time.  Psychiatric:        Attention and Perception: Attention normal.        Speech: Speech normal.        Behavior: Behavior normal. Behavior is cooperative.          Assessment & Plan:   Encounter Diagnoses  Name Primary?   Abdominal pain, unspecified abdominal location Yes   Tinnitus of both ears    Nonintractable headache, unspecified chronicity pattern, unspecified headache type    Sinusitis, unspecified chronicity, unspecified location    SOB (shortness of breath)    Flu-like symptoms       -pt to Increase omeprazole to bid -discussed with pt that he Needs  covid test in light of "flu-like symptoms" over the past 2 weeks and now complains of SOB.  Discussed that testing can be done at [facility] for no charge -discussed with pt that he Should notify ENT.  They may want to either test him or possible reschedule -If covid test is negative, discussed that he needs to go ahead and get vaccinated.  He is reassured that vaccine is now FDA approved and he seems ready to get the vaccine.  Discussed where he can get that done.  -pt to F/u here for abd issues 1 month.  He is to contact office sooner prn worsening or new symptoms

## 2020-07-27 ENCOUNTER — Other Ambulatory Visit: Payer: Self-pay | Admitting: Physician Assistant

## 2020-07-27 MED ORDER — OMEPRAZOLE 40 MG PO CPDR: 40.0000 mg | DELAYED_RELEASE_CAPSULE | Freq: Two times a day (BID) | ORAL | 3 refills | Status: DC

## 2020-08-08 ENCOUNTER — Encounter: Payer: Self-pay | Admitting: Physician Assistant

## 2020-08-08 ENCOUNTER — Other Ambulatory Visit: Payer: Self-pay | Admitting: Physician Assistant

## 2020-08-08 MED ORDER — PANTOPRAZOLE SODIUM 40 MG PO TBEC
40.0000 mg | DELAYED_RELEASE_TABLET | Freq: Every day | ORAL | 3 refills | Status: DC
Start: 2020-08-08 — End: 2020-08-28

## 2020-08-08 NOTE — Progress Notes (Signed)
Pt called reporting no improvements with abd even after increasing omeprazole to BID as recommended on 07-25-20.  PA will send rx for pantoprazole to pt's pharmacy and if pt in agreement can refer to GI.   Pt was notified and agrees on GI referral. Pt's pharmacy is [facility]. Pt counseled on need to submit CAFA. Pt verbalized understanding and states his mother will come by the office to pick up CAFA application. rx sent

## 2020-08-10 ENCOUNTER — Encounter: Payer: Self-pay | Admitting: Internal Medicine

## 2020-08-28 ENCOUNTER — Encounter: Payer: Self-pay | Admitting: Physician Assistant

## 2020-08-28 ENCOUNTER — Ambulatory Visit: Payer: Self-pay | Admitting: Physician Assistant

## 2020-08-28 VITALS — BP 160/90 | HR 84 | Temp 97.0°F | Wt 221.8 lb

## 2020-08-28 DIAGNOSIS — R109 Unspecified abdominal pain: Secondary | ICD-10-CM

## 2020-08-28 DIAGNOSIS — I10 Essential (primary) hypertension: Secondary | ICD-10-CM

## 2020-08-28 DIAGNOSIS — J329 Chronic sinusitis, unspecified: Secondary | ICD-10-CM

## 2020-08-28 DIAGNOSIS — E669 Obesity, unspecified: Secondary | ICD-10-CM

## 2020-08-28 MED ORDER — METOPROLOL TARTRATE 50 MG PO TABS: 50.0000 mg | ORAL_TABLET | Freq: Two times a day (BID) | ORAL | 2 refills | Status: DC

## 2020-08-28 MED ORDER — PANTOPRAZOLE SODIUM 40 MG PO TBEC: 40.0000 mg | DELAYED_RELEASE_TABLET | Freq: Two times a day (BID) | ORAL | 1 refills | Status: DC

## 2020-08-28 NOTE — Progress Notes (Signed)
BP (!) 160/90   Pulse 84   Temp (!) 97 F (36.1 C)   Wt 221 lb 12.8 oz (100.6 kg)   SpO2 98%   BMI 30.08 kg/m    Subjective:    Patient ID: Christopher Castro, male    DOB: 1986/01/13, 34 y.o.   MRN: [id number]  HPI: Christopher Castro is a 34 y.o. male presenting on 08/28/2020 for No chief complaint on file.   HPI   Pt had a negative covid 19 screening [place]   Pt is 34yoM with follow up of abdominal pain.  He gotr his first dose of covid vaccination.  He doesn't have his card with him.  Pt hasn't noticed any improvement in his abdominal symptoms with protonix.   He has GI appointment end of October.   He reports "Intense pain" that comes and goes but says his discomfort is there always.   He is seeing dr Ardyth Harps with ENT for chronic sinusitis and nasal polyp.  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     Relevant past medical, surgical, family and social history reviewed and updated as indicated. Interim medical history since our last visit reviewed. Allergies and medications reviewed and updated.    Current Outpatient Medications:  .  buprenorphine (SUBUTEX) 8 MG SUBL SL tablet, Place under the tongue daily., Disp: , Rfl:  .  pantoprazole (PROTONIX) 40 MG tablet, Take 1 tablet (40 mg total) by mouth daily., Disp: 30 tablet, Rfl: 3     Review of Systems  Per HPI unless specifically indicated above     Objective:    BP (!) 160/90   Pulse 84   Temp (!) 97 F (36.1 C)   Wt 221 lb 12.8 oz (100.6 kg)   SpO2 98%   BMI 30.08 kg/m   Wt Readings from Last 3 Encounters:  08/28/20 221 lb 12.8 oz (100.6 kg)  06/13/20 219 lb 12 oz (99.7 kg)  05/23/20 214 lb 8 oz (97.3 kg)    Physical Exam Vitals reviewed.  Constitutional:      General: He is not in acute distress.    Appearance: He is well-developed. He is obese. He is not ill-appearing or toxic-appearing.  HENT:     Head: Normocephalic and atraumatic.  Cardiovascular:     Rate and Rhythm:  Normal rate and regular rhythm.  Pulmonary:     Effort: Pulmonary effort is normal.     Breath sounds: Normal breath sounds. No wheezing.  Abdominal:     General: Bowel sounds are normal.     Palpations: Abdomen is soft. There is no hepatomegaly, splenomegaly, mass or pulsatile mass.     Tenderness: There is abdominal tenderness in the suprapubic area. There is no guarding or rebound.     Comments: Mildly tender inferior to umbilicus  Musculoskeletal:     Cervical back: Neck supple.     Right lower leg: No edema.     Left lower leg: No edema.  Lymphadenopathy:     Cervical: No cervical adenopathy.  Skin:    General: Skin is warm and dry.  Neurological:     Mental Status: He is alert and oriented to person, place, and time.  Psychiatric:        Behavior: Behavior normal.         Assessment & Plan:    Encounter Diagnoses  Name Primary?  . Hypertension, unspecified type Yes  . Abdominal pain, unspecified abdominal location   . Sinusitis, unspecified  chronicity, unspecified location   . Obesity, unspecified classification, unspecified obesity type, unspecified whether serious comorbidity present      -Increase protonix to bid.  GI appointment as scheduled -rx metoprolol for elevated BP -recomend meditation for the pt who admits to lots of stress and anxiety about his health.  His worry is worn clearly on his face.  Discussed with pt that it will help with his stress and anxiety as well as improved his BP and could possibly help his abd pain -pt to bring covid vaccination card to next appointment -pt to follow up 6 wk to recheck bp.  He is to contact office sooner prn worsening or new symptoms

## 2020-09-29 ENCOUNTER — Encounter: Payer: Self-pay | Admitting: Gastroenterology

## 2020-09-29 ENCOUNTER — Other Ambulatory Visit: Payer: Self-pay

## 2020-09-29 ENCOUNTER — Ambulatory Visit (INDEPENDENT_AMBULATORY_CARE_PROVIDER_SITE_OTHER): Payer: Self-pay | Admitting: Gastroenterology

## 2020-09-29 DIAGNOSIS — R0609 Other forms of dyspnea: Secondary | ICD-10-CM

## 2020-09-29 DIAGNOSIS — R1012 Left upper quadrant pain: Secondary | ICD-10-CM | POA: Insufficient documentation

## 2020-09-29 DIAGNOSIS — R079 Chest pain, unspecified: Secondary | ICD-10-CM

## 2020-09-29 DIAGNOSIS — R0600 Dyspnea, unspecified: Secondary | ICD-10-CM

## 2020-09-29 NOTE — H&P (View-Only) (Signed)
Primary Care Physician:  Jacquelin Hawking, PA-C  Referring Provider: Jacquelin Hawking, PA-C Primary Gastroenterologist:  Dr. Jena Gauss   Chief Complaint  Patient presents with   Abdominal Pain    pain under rib cage on left side daily x 1 year    HPI:   Christopher Castro is a 34 y.o. male presenting today at the request of Jacquelin Hawking, PA-C, due to abdominal pain. Outside non-contrast CT in [facility] unremarkable April 2021. CBC, CMP unremarkable July 2021.   Last fall started LUQ pain that was sharp and sporadic but now occurring freuqently. Mostly below left ribcage but will travel some. Rarely RUQ. About 2-3 months ago hurting in left-sided chest when waking in the morning.   Bad sinus pressures and headaches. Feels nauseated all the time daily. Hard to work. Feels bad most of the time. Will come and go in minutes. No vomiting.  Minor indigestion.  No significant loss of appetite. No unexplained weight loss. No dyshagia. Occasional NSAIDs but not often. Nothing relieves. If bending over it hurts, laying on left side hurts, no significant pain with eating. Unable to tell any food triggers. No belching/burping. Protonix BID but taking after eating.   About 2-3 months ago felt short of breath with exertion. No chest pain with exertion. Only left-sided chest discomfort with waking. Gets out of breath if talking for a long time. He is concerned about this.   Past Medical History:  Diagnosis Date   Anxiety    HTN (hypertension)    Substance abuse (HCC)    remote past    Past Surgical History:  Procedure Laterality Date   WISDOM TOOTH EXTRACTION      Current Outpatient Medications  Medication Sig Dispense Refill   buprenorphine (SUBUTEX) 8 MG SUBL SL tablet Place 8 mg under the tongue daily.      ibuprofen (ADVIL) 200 MG tablet Take 400-600 mg by mouth every 8 (eight) hours as needed for moderate pain.      metoprolol tartrate (LOPRESSOR) 50 MG tablet Take 1 tablet (50 mg total)  by mouth 2 (two) times daily. 60 tablet 2   pantoprazole (PROTONIX) 40 MG tablet Take 1 tablet (40 mg total) by mouth 2 (two) times daily before a meal. 60 tablet 1   fluticasone (FLONASE) 50 MCG/ACT nasal spray Place 1 spray into both nostrils daily.     pseudoephedrine (SUDAFED) 120 MG 12 hr tablet Take 60 mg by mouth daily as needed for congestion.     No current facility-administered medications for this visit.    Allergies as of 09/29/2020 - Review Complete 09/29/2020  Allergen Reaction Noted   Augmentin [amoxicillin-pot clavulanate] Nausea And Vomiting 06/13/2020    Family History  Problem Relation Age of Onset   Asthma Brother    Asthma Brother    Colon cancer Neg Hx    Colon polyps Neg Hx     Social History   Socioeconomic History   Marital status: Single    Spouse name: Not on file   Number of children: Not on file   Years of education: Not on file   Highest education level: Not on file  Occupational History   Occupation: substitute teacher  Tobacco Use   Smoking status: Former Smoker    Packs/day: 0.75    Years: 18.00    Pack years: 13.50    Types: Cigarettes    Quit date: 07/11/2020    Years since quitting: 0.2   Smokeless tobacco: Never Used  Vaping Use   Vaping Use: Never used  Substance and Sexual Activity   Alcohol use: Not Currently   Drug use: Not Currently    Types: Opium    Comment: hx of opoid use (percocet/hydrocodone) last used 2017   Sexual activity: Not on file  Other Topics Concern   Not on file  Social History Narrative   Not on file   Social Determinants of Health   Financial Resource Strain:    Difficulty of Paying Living Expenses: Not on file  Food Insecurity:    Worried About Running Out of Food in the Last Year: Not on file   Ran Out of Food in the Last Year: Not on file  Transportation Needs:    Lack of Transportation (Medical): Not on file   Lack of Transportation (Non-Medical): Not on file    Physical Activity:    Days of Exercise per Week: Not on file   Minutes of Exercise per Session: Not on file  Stress:    Feeling of Stress : Not on file  Social Connections:    Frequency of Communication with Friends and Family: Not on file   Frequency of Social Gatherings with Friends and Family: Not on file   Attends Religious Services: Not on file   Active Member of Clubs or Organizations: Not on file   Attends Banker Meetings: Not on file   Marital Status: Not on file  Intimate Partner Violence:    Fear of Current or Ex-Partner: Not on file   Emotionally Abused: Not on file   Physically Abused: Not on file   Sexually Abused: Not on file    Review of Systems: Gen: Denies any fever, chills, fatigue, weight loss, lack of appetite.  CV: see HPI Resp: Denies shortness of breath at rest or with exertion. Denies wheezing or cough.  GI: see HPI GU : Denies urinary burning, urinary frequency, urinary hesitancy MS: Denies joint pain, muscle weakness, cramps, or limitation of movement.  Derm: Denies rash, itching, dry skin Psych: Denies depression, anxiety, memory loss, and confusion Heme: Denies bruising, bleeding, and enlarged lymph nodes.  Physical Exam: BP (!) 152/85    Pulse 73    Temp (!) 97.5 F (36.4 C) (Temporal)    Ht 6\' 1"  (1.854 m)    Wt 228 lb (103.4 kg)    BMI 30.08 kg/m  General:   Alert and oriented. Pleasant and cooperative. Well-nourished and well-developed.  Head:  Normocephalic and atraumatic. Eyes:  Without icterus, sclera clear and conjunctiva pink.  Ears:  Normal auditory acuity. Mouth:  No deformity or lesions, oral mucosa pink.  Lungs:  Clear to auscultation bilaterally.  Heart:  S1, S2 present, query soft systolic murmur? Abdomen:  +BS, soft, non-tender and non-distended. No HSM noted. No guarding or rebound. No masses appreciated.  Rectal:  Deferred  Msk:  Symmetrical without gross deformities. Normal posture. Extremities:   Without edema. Neurologic:  Alert and  oriented x4;  grossly normal neurologically. Psych:  Alert and cooperative. Normal mood and affect.  ASSESSMENT: Christopher Castro is a 34 y.o. male presenting today with approximately one year history of LUQ pain that is now worsening in frequency, nausea, with no improvement on Protonix BID. Rare NSAIDs. Labs unrevealing, and CT (without contrast) unremarkable in [place] earlier this year. He has woken with left-sided chest wall discomfort at times but denies any significant reflux symptoms. No prior EGD.   Will pursue diagnostic EGD in near future. I have asked him  to change Protonix dosing to 30 minutes before breakfast and dinner.   Occasional shortness of breath on exertion that is concerning to patient, but denies any chest pain with exertion. He desires cardiology referral.     PLAN:  Proceed with upper endoscopy by Dr. Jena Gauss in near future using PROPOFOL: the risks, benefits, and alternatives have been discussed with the patient in detail. The patient states understanding and desires to proceed.   Protonix BID before meals  Cardiology referral  Return in follow-up thereafter   Gelene Mink, PhD, ANP-BC [facility]astro[facility]

## 2020-09-29 NOTE — Patient Instructions (Signed)
Continue Protonix twice a day, but make sure to take this on an empty stomach 30 minutes before breakfast and dinner. After about 2 weeks, let me know how this works for you.  I am referring you to cardiology just to exclude any cardiac component.  We are arranging an upper endoscopy in the near future!  We will see you in follow-up thereafter!   It was a pleasure to see you today. I want to create trusting relationships with patients to provide genuine, compassionate, and quality care. I value your feedback. If you receive a survey regarding your visit,  I greatly appreciate you taking time to fill this out.   Gelene Mink, PhD, ANP-BC [facility]astroenter[facility]

## 2020-09-29 NOTE — Progress Notes (Signed)
° ° °Primary Care Physician:  McElroy, Shannon, PA-C  °Referring Provider: Shannon McElroy, PA-C °Primary Gastroenterologist:  Dr. Rourk  ° °Chief Complaint  °Patient presents with  °• Abdominal Pain  °  pain under rib cage on left side daily x 1 year  ° ° °HPI:   °Christopher Castro is a 34 y.o. male presenting today at the request of Shannon McElroy, PA-C, due to abdominal pain. Outside non-contrast CT in [place] unremarkable April 2021. CBC, CMP unremarkable July 2021.  ° °Last fall started LUQ pain that was sharp and sporadic but now occurring freuqently. Mostly below left ribcage but will travel some. Rarely RUQ. About 2-3 months ago hurting in left-sided chest when waking in the morning.  ° °Bad sinus pressures and headaches. Feels nauseated all the time daily. Hard to work. Feels bad most of the time. Will come and go in minutes. No vomiting.  Minor indigestion.  No significant loss of appetite. No unexplained weight loss. No dyshagia. Occasional NSAIDs but not often. Nothing relieves. If bending over it hurts, laying on left side hurts, no significant pain with eating. Unable to tell any food triggers. No belching/burping. Protonix BID but taking after eating.  ° °About 2-3 months ago felt short of breath with exertion. No chest pain with exertion. Only left-sided chest discomfort with waking. Gets out of breath if talking for a long time. He is concerned about this.  ° °Past Medical History:  °Diagnosis Date  °• Anxiety   °• HTN (hypertension)   °• Substance abuse (HCC)   ° remote past  ° ° °Past Surgical History:  °Procedure Laterality Date  °• WISDOM TOOTH EXTRACTION    ° ° °Current Outpatient Medications  °Medication Sig Dispense Refill  °• buprenorphine (SUBUTEX) 8 MG SUBL SL tablet Place 8 mg under the tongue daily.     °• ibuprofen (ADVIL) 200 MG tablet Take 400-600 mg by mouth every 8 (eight) hours as needed for moderate pain.     °• metoprolol tartrate (LOPRESSOR) 50 MG tablet Take 1 tablet (50 mg total)  by mouth 2 (two) times daily. 60 tablet 2  °• pantoprazole (PROTONIX) 40 MG tablet Take 1 tablet (40 mg total) by mouth 2 (two) times daily before a meal. 60 tablet 1  °• fluticasone (FLONASE) 50 MCG/ACT nasal spray Place 1 spray into both nostrils daily.    °• pseudoephedrine (SUDAFED) 120 MG 12 hr tablet Take 60 mg by mouth daily as needed for congestion.    ° °No current facility-administered medications for this visit.  ° ° °Allergies as of 09/29/2020 - Review Complete 09/29/2020  °Allergen Reaction Noted  °• Augmentin [amoxicillin-pot clavulanate] Nausea And Vomiting 06/13/2020  ° ° °Family History  °Problem Relation Age of Onset  °• Asthma Brother   °• Asthma Brother   °• Colon cancer Neg Hx   °• Colon polyps Neg Hx   ° ° °Social History  ° °Socioeconomic History  °• Marital status: Single  °  Spouse name: Not on file  °• Number of children: Not on file  °• Years of education: Not on file  °• Highest education level: Not on file  °Occupational History  °• Occupation: substitute teacher  °Tobacco Use  °• Smoking status: Former Smoker  °  Packs/day: 0.75  °  Years: 18.00  °  Pack years: 13.50  °  Types: Cigarettes  °  Quit date: 07/11/2020  °  Years since quitting: 0.2  °• Smokeless tobacco: Never Used  °  Vaping Use  °• Vaping Use: Never used  °Substance and Sexual Activity  °• Alcohol use: Not Currently  °• Drug use: Not Currently  °  Types: Opium  °  Comment: hx of opoid use (percocet/hydrocodone) last used 2017  °• Sexual activity: Not on file  °Other Topics Concern  °• Not on file  °Social History Narrative  °• Not on file  ° °Social Determinants of Health  ° °Financial Resource Strain:   °• Difficulty of Paying Living Expenses: Not on file  °Food Insecurity:   °• Worried About Running Out of Food in the Last Year: Not on file  °• Ran Out of Food in the Last Year: Not on file  °Transportation Needs:   °• Lack of Transportation (Medical): Not on file  °• Lack of Transportation (Non-Medical): Not on file    °Physical Activity:   °• Days of Exercise per Week: Not on file  °• Minutes of Exercise per Session: Not on file  °Stress:   °• Feeling of Stress : Not on file  °Social Connections:   °• Frequency of Communication with Friends and Family: Not on file  °• Frequency of Social Gatherings with Friends and Family: Not on file  °• Attends Religious Services: Not on file  °• Active Member of Clubs or Organizations: Not on file  °• Attends Club or Organization Meetings: Not on file  °• Marital Status: Not on file  °Intimate Partner Violence:   °• Fear of Current or Ex-Partner: Not on file  °• Emotionally Abused: Not on file  °• Physically Abused: Not on file  °• Sexually Abused: Not on file  ° ° °Review of Systems: °Gen: Denies any fever, chills, fatigue, weight loss, lack of appetite.  °CV: see HPI °Resp: Denies shortness of breath at rest or with exertion. Denies wheezing or cough.  °GI: see HPI °GU : Denies urinary burning, urinary frequency, urinary hesitancy °MS: Denies joint pain, muscle weakness, cramps, or limitation of movement.  °Derm: Denies rash, itching, dry skin °Psych: Denies depression, anxiety, memory loss, and confusion °Heme: Denies bruising, bleeding, and enlarged lymph nodes. ° °Physical Exam: °BP (!) 152/85    Pulse 73    Temp (!) 97.5 °F (36.4 °C) (Temporal)    Ht 6' 1" (1.854 m)    Wt 228 lb (103.4 kg)    BMI 30.08 kg/m²  °General:   Alert and oriented. Pleasant and cooperative. Well-nourished and well-developed.  °Head:  Normocephalic and atraumatic. °Eyes:  Without icterus, sclera clear and conjunctiva pink.  °Ears:  Normal auditory acuity. °Mouth:  No deformity or lesions, oral mucosa pink.  °Lungs:  Clear to auscultation bilaterally.  °Heart:  S1, S2 present, query soft systolic murmur? °Abdomen:  +BS, soft, non-tender and non-distended. No HSM noted. No guarding or rebound. No masses appreciated.  °Rectal:  Deferred  °Msk:  Symmetrical without gross deformities. Normal posture. °Extremities:   Without edema. °Neurologic:  Alert and  oriented x4;  grossly normal neurologically. °Psych:  Alert and cooperative. Normal mood and affect. ° °ASSESSMENT: °Christopher Castro is a 34 y.o. male presenting today with approximately one year history of LUQ pain that is now worsening in frequency, nausea, with no improvement on Protonix BID. Rare NSAIDs. Labs unrevealing, and CT (without contrast) unremarkable in [place] earlier this year. He has woken with left-sided chest wall discomfort at times but denies any significant reflux symptoms. No prior EGD.  ° °Will pursue diagnostic EGD in near future. I have asked him   to change Protonix dosing to 30 minutes before breakfast and dinner.  ° °Occasional shortness of breath on exertion that is concerning to patient, but denies any chest pain with exertion. He desires cardiology referral.   ° ° °PLAN: ° °Proceed with upper endoscopy by Dr. Rourk in near future using PROPOFOL: the risks, benefits, and alternatives have been discussed with the patient in detail. The patient states understanding and desires to proceed.  ° °Protonix BID before meals ° °Cardiology referral ° °Return in follow-up thereafter ° ° °Paz Winsett W. Jahayra Mazo, PhD, ANP-BC °[facility]  ° °

## 2020-10-02 ENCOUNTER — Encounter: Payer: Self-pay | Admitting: Physician Assistant

## 2020-10-02 ENCOUNTER — Ambulatory Visit: Payer: Self-pay | Admitting: Physician Assistant

## 2020-10-02 VITALS — BP 126/72 | HR 73 | Temp 98.6°F | Ht 73.0 in | Wt 227.7 lb

## 2020-10-02 DIAGNOSIS — R109 Unspecified abdominal pain: Secondary | ICD-10-CM

## 2020-10-02 DIAGNOSIS — J329 Chronic sinusitis, unspecified: Secondary | ICD-10-CM

## 2020-10-02 DIAGNOSIS — I10 Essential (primary) hypertension: Secondary | ICD-10-CM

## 2020-10-02 DIAGNOSIS — E669 Obesity, unspecified: Secondary | ICD-10-CM

## 2020-10-02 DIAGNOSIS — E785 Hyperlipidemia, unspecified: Secondary | ICD-10-CM

## 2020-10-02 NOTE — Progress Notes (Signed)
BP 126/72   Pulse 73   Temp 98.6 F (37 C)   Ht 6\' 1"  (1.854 m)   Wt 227 lb 11.8 oz (103.3 kg)   SpO2 97%   BMI 30.05 kg/m    Subjective:    Patient ID: , male    DOB: Nov 30, 1986, 34 y.o.   MRN: [id number]  HPI: Christopher Castro is a 34 y.o. male presenting on 10/02/2020 for Hypertension   HPI   Pt had a negative covid 19 screening questionnaire.    Pt is 34yoM with follow for HTN.  He is doing well on metoprolol.   He got his first dose of covid vaccination in September.    He didn't get his second one.  He says he was not feeling well the day he had the appointment for the 2nd dose.  Pt is scheduled for EGD later this week.  He is seeing dr October with ENT for chronic sinusitis and nasal polyp.  He has appt later today.   Pt works as [employer] but not much lately    Relevant past medical, surgical, family and social history reviewed and updated as indicated. Interim medical history since our last visit reviewed. Allergies and medications reviewed and updated.   Current Outpatient Medications:  .  buprenorphine (SUBUTEX) 8 MG SUBL SL tablet, Place 8 mg under the tongue daily. , Disp: , Rfl:  .  fluticasone (FLONASE) 50 MCG/ACT nasal spray, Place 1 spray into both nostrils daily., Disp: , Rfl:  .  ibuprofen (ADVIL) 200 MG tablet, Take 400-600 mg by mouth every 8 (eight) hours as needed for moderate pain. , Disp: , Rfl:  .  metoprolol tartrate (LOPRESSOR) 50 MG tablet, Take 1 tablet (50 mg total) by mouth 2 (two) times daily., Disp: 60 tablet, Rfl: 2 .  pantoprazole (PROTONIX) 40 MG tablet, Take 1 tablet (40 mg total) by mouth 2 (two) times daily before a meal., Disp: 60 tablet, Rfl: 1 .  pseudoephedrine (SUDAFED) 120 MG 12 hr tablet, Take 60 mg by mouth daily as needed for congestion., Disp: , Rfl:     Review of Systems  Per HPI unless specifically indicated above     Objective:    BP 126/72   Pulse 73   Temp 98.6 F (37 C)   Ht  6\' 1"  (1.854 m)   Wt 227 lb 11.8 oz (103.3 kg)   SpO2 97%   BMI 30.05 kg/m   Wt Readings from Last 3 Encounters:  10/02/20 227 lb 11.8 oz (103.3 kg)  09/29/20 228 lb (103.4 kg)  08/28/20 221 lb 12.8 oz (100.6 kg)    Physical Exam Vitals reviewed.  Constitutional:      General: He is not in acute distress.    Appearance: He is well-developed. He is obese.  HENT:     Head: Normocephalic and atraumatic.  Cardiovascular:     Rate and Rhythm: Normal rate and regular rhythm.  Pulmonary:     Effort: Pulmonary effort is normal.     Breath sounds: Normal breath sounds. No wheezing.  Abdominal:     General: Bowel sounds are normal.     Palpations: Abdomen is soft.  Musculoskeletal:     Cervical back: Neck supple.     Right lower leg: No edema.     Left lower leg: No edema.  Lymphadenopathy:     Cervical: No cervical adenopathy.  Skin:    General: Skin is warm and dry.  Neurological:  Mental Status: He is alert and oriented to person, place, and time.  Psychiatric:        Behavior: Behavior normal.           Assessment & Plan:    Encounter Diagnoses  Name Primary?  . Hypertension, unspecified type Yes  . Abdominal pain, unspecified abdominal location   . Sinusitis, unspecified chronicity, unspecified location   . Obesity, unspecified classification, unspecified obesity type, unspecified whether serious comorbidity present   . Hyperlipidemia, unspecified hyperlipidemia type     -Pt to continue metoprolol for HTN -pt to continue with GI for abdominal pain -pt to continue with ENT for chronic sinusitis -pt to continue lowfat diet and regular exercise for lipids.   -pt to follow up here 3 months.  Pt to contact office sooner prn

## 2020-10-04 ENCOUNTER — Other Ambulatory Visit: Payer: Self-pay

## 2020-10-04 ENCOUNTER — Other Ambulatory Visit (HOSPITAL_COMMUNITY)
Admission: RE | Admit: 2020-10-04 | Discharge: 2020-10-04 | Disposition: A | Discharge status: Home / Self Care | Payer: HRSA Program | Source: Ambulatory Visit | Attending: Internal Medicine | Admitting: Internal Medicine

## 2020-10-04 DIAGNOSIS — Z20822 Contact with and (suspected) exposure to covid-19: Secondary | ICD-10-CM | POA: Diagnosis not present

## 2020-10-04 DIAGNOSIS — Z01812 Encounter for preprocedural laboratory examination: Secondary | ICD-10-CM | POA: Insufficient documentation

## 2020-10-04 LAB — SARS CORONAVIRUS 2 (TAT 6-24 HRS): SARS Coronavirus 2: NEGATIVE

## 2020-10-05 ENCOUNTER — Encounter (HOSPITAL_COMMUNITY)
Admission: RE | Disposition: A | Discharge status: Home / Self Care | Payer: Self-pay | Source: Home / Self Care | Attending: Internal Medicine

## 2020-10-05 ENCOUNTER — Ambulatory Visit (HOSPITAL_COMMUNITY)
Admission: RE | Admit: 2020-10-05 | Discharge: 2020-10-05 | Disposition: A | Discharge status: Home / Self Care | Payer: Self-pay | Attending: Internal Medicine | Admitting: Internal Medicine

## 2020-10-05 ENCOUNTER — Ambulatory Visit (HOSPITAL_COMMUNITY): Payer: Self-pay | Admitting: Anesthesiology

## 2020-10-05 ENCOUNTER — Other Ambulatory Visit: Payer: Self-pay

## 2020-10-05 ENCOUNTER — Encounter (HOSPITAL_COMMUNITY): Payer: Self-pay | Admitting: Internal Medicine

## 2020-10-05 DIAGNOSIS — Z87891 Personal history of nicotine dependence: Secondary | ICD-10-CM | POA: Insufficient documentation

## 2020-10-05 DIAGNOSIS — R1013 Epigastric pain: Secondary | ICD-10-CM | POA: Insufficient documentation

## 2020-10-05 DIAGNOSIS — Z880 Allergy status to penicillin: Secondary | ICD-10-CM | POA: Insufficient documentation

## 2020-10-05 DIAGNOSIS — K449 Diaphragmatic hernia without obstruction or gangrene: Secondary | ICD-10-CM | POA: Insufficient documentation

## 2020-10-05 HISTORY — PX: ESOPHAGOGASTRODUODENOSCOPY (EGD) WITH PROPOFOL: SHX5813

## 2020-10-05 SURGERY — ESOPHAGOGASTRODUODENOSCOPY (EGD) WITH PROPOFOL
Anesthesia: General

## 2020-10-05 MED ORDER — LIDOCAINE VISCOUS HCL 2 % MT SOLN
OROMUCOSAL | Status: AC
  Administered 2020-10-05: 15 mL via OROMUCOSAL
  Filled 2020-10-05: qty 15

## 2020-10-05 MED ORDER — LIDOCAINE HCL (CARDIAC) PF 100 MG/5ML IV SOSY
PREFILLED_SYRINGE | INTRAVENOUS | Status: DC | PRN
  Administered 2020-10-05: 100 mg via INTRAVENOUS

## 2020-10-05 MED ORDER — LIDOCAINE VISCOUS HCL 2 % MT SOLN: 15.0000 mL | Freq: Once | OROMUCOSAL | Status: AC

## 2020-10-05 MED ORDER — LACTATED RINGERS IV SOLN: INTRAVENOUS | Status: DC

## 2020-10-05 MED ORDER — GLYCOPYRROLATE 0.2 MG/ML IJ SOLN
INTRAMUSCULAR | Status: AC
  Administered 2020-10-05: 0.2 mg via INTRAVENOUS
  Filled 2020-10-05: qty 1

## 2020-10-05 MED ORDER — GLYCOPYRROLATE 0.2 MG/ML IJ SOLN: 0.2000 mg | Freq: Once | INTRAMUSCULAR | Status: AC

## 2020-10-05 MED ORDER — PROPOFOL 10 MG/ML IV BOLUS
INTRAVENOUS | Status: DC | PRN
  Administered 2020-10-05: 140 mg via INTRAVENOUS

## 2020-10-05 NOTE — Transfer of Care (Signed)
Immediate Anesthesia Transfer of Care Note  Patient: Christopher Castro  Procedure(s) Performed: ESOPHAGOGASTRODUODENOSCOPY (EGD) WITH PROPOFOL (N/A )  Patient Location: PACU  Anesthesia Type:General  Level of Consciousness: drowsy  Airway & Oxygen Therapy: Patient Spontanous Breathing  Post-op Assessment: Report given to RN and Post -op Vital signs reviewed and stable  Post vital signs: Reviewed and stable  Last Vitals:  Vitals Value Taken Time  BP    Temp    Pulse    Resp    SpO2      Last Pain:  Vitals:   10/05/20 1138  TempSrc:   PainSc: 3       Patients Stated Pain Goal: 7 (10/05/20 1057)  Complications: No complications documented.

## 2020-10-05 NOTE — Anesthesia Postprocedure Evaluation (Signed)
Anesthesia Post Note  Patient: Christopher Castro  Procedure(s) Performed: ESOPHAGOGASTRODUODENOSCOPY (EGD) WITH PROPOFOL (N/A )  Patient location during evaluation: PACU Anesthesia Type: General Level of consciousness: awake and alert Pain management: pain level controlled Vital Signs Assessment: post-procedure vital signs reviewed and stable Respiratory status: spontaneous breathing Postop Assessment: no headache Anesthetic complications: no   No complications documented.   Last Vitals:  Vitals:   10/05/20 1108  BP: 136/87  Pulse: 61  Resp: 12  Temp: 36.9 C  SpO2: 100%    Last Pain:  Vitals:   10/05/20 1138  TempSrc:   PainSc: 3                  Lorin Glass

## 2020-10-05 NOTE — Op Note (Signed)
[facility] Patient Name: Christopher Castro Procedure Date: 10/05/2020 11:25 AM MRN: [id number] Date of Birth: October 24, 1986 Attending MD: Gennette Pac , MD CSN: [id number] Age: 34 Admit Type: Outpatient Procedure:                Upper GI endoscopy Indications:              Dyspepsia Providers:                Gennette Pac, MD, Criselda Peaches. Mathis Fare Charity fundraiser, RN,                            Judeth Cornfield. Jessee Avers, Technician, Dyann Ruddle Referring MD:              Medicines:                Propofol per Anesthesia Complications:            No immediate complications. Estimated Blood Loss:     Estimated blood loss: none. Procedure:                Pre-Anesthesia Assessment:                           - Prior to the procedure, a History and Physical                            was performed, and patient medications and                            allergies were reviewed. The patient's tolerance of                            previous anesthesia was also reviewed. The risks                            and benefits of the procedure and the sedation                            options and risks were discussed with the patient.                            All questions were answered, and informed consent                            was obtained. Prior Anticoagulants: The patient has                            taken no previous anticoagulant or antiplatelet                            agents. ASA Grade Assessment: II - A patient with                            mild systemic disease. After reviewing the risks  and benefits, the patient was deemed in                            satisfactory condition to undergo the procedure.                           After obtaining informed consent, the endoscope was                            passed under direct vision. Throughout the                            procedure, the patient's blood pressure, pulse, and                             oxygen saturations were monitored continuously. The                            [id number]) was introduced through the mouth,                            and advanced to the second part of duodenum. The                            upper GI endoscopy was accomplished without                            difficulty. The patient tolerated the procedure                            well. Scope In: 11:42:18 AM Scope Out: 11:46:43 AM Total Procedure Duration: 0 hours 4 minutes 25 seconds  Findings:      The examined esophagus was normal.      A small hiatal hernia was present.      The exam was otherwise without abnormality.      The duodenal bulb and second portion of the duodenum were normal. Impression:               - Normal esophagus.                           - Small hiatal hernia.                           - The examination was otherwise normal.                           - Normal duodenal bulb and second portion of the                            duodenum. Small hiatal hernia is likely producing                            any symptoms. Based on physical exam and findings  today, patient's upper?"left-sided abdominal pain                            may not be emanating from his GI tract. No response                            to twice daily Protonix empirically. Moderate Sedation:      Moderate (conscious) sedation was personally administered by an       anesthesia professional. The following parameters were monitored: oxygen       saturation, heart rate, blood pressure, and response to care. Recommendation:           - Patient has a contact number available for                            emergencies. The signs and symptoms of potential                            delayed complications were discussed with the                            patient. Return to normal activities tomorrow.                            Written discharge instructions were provided to the                             patient.                           - Resume previous diet.                           - Continue present medications. Stop Protonix.;                            Trial of Dexilant 60 mg once daily. Samples x2                            weeks. Patient is to report in 2 weeks and give us                            a progress report on his symptoms.                           -Office visit with us in 4 weeks. Procedure Code(s):        --- Professional ---                           [phone], Esophagogastroduodenoscopy, flexible,                            transoral; diagnostic, including collection of  specimen(s) by brushing or washing, when performed                            (separate procedure) Diagnosis Code(s):        --- Professional ---                           K44.9, Diaphragmatic hernia without obstruction or                            gangrene                           R10.13, Epigastric pain CPT copyright 2019 American Medical Association. All rights reserved. The codes documented in this report are preliminary and upon coder review may  be revised to meet current compliance requirements. [name], MD Gennette Pac, MD 10/05/2020 12:02:40 PM This report has been signed electronically. Number of Addenda: 0

## 2020-10-05 NOTE — Addendum Note (Signed)
Addendum  created 10/05/20 1331 by Shanon Payor, CRNA   Charge Capture section accepted

## 2020-10-05 NOTE — Interval H&P Note (Signed)
History and Physical Interval Note:  10/05/2020 11:35 AM  Christopher Castro  has presented today for surgery, with the diagnosis of dyspepsia.  The various methods of treatment have been discussed with the patient and family. After consideration of risks, benefits and other options for treatment, the patient has consented to  Procedure(s) with comments: ESOPHAGOGASTRODUODENOSCOPY (EGD) WITH PROPOFOL (N/A) - 12:15pm as a surgical intervention.  The patient's history has been reviewed, patient examined, no change in status, stable for surgery.  I have reviewed the patient's chart and labs.  Questions were answered to the patient's satisfaction.     Christopher Castro  No change with Protonix twice daily.  No dysphagia.  Diagnostic EGD per plan. The risks, benefits, limitations, alternatives and imponderables have been reviewed with the patient. Potential for esophageal dilation, biopsy, etc. have also been reviewed.  Questions have been answered. All parties agreeable.

## 2020-10-05 NOTE — Anesthesia Preprocedure Evaluation (Signed)
Anesthesia Evaluation  Patient identified by MRN, date of birth, ID band Patient awake    Reviewed: Allergy & Precautions, H&P , NPO status , [name] Chart, lab work & pertinent test results, reviewed documented beta blocker date and time   Airway Mallampati: II  TM Distance: >3 FB Neck ROM: full    Dental no notable dental hx. (+) Teeth Intact   Pulmonary neg pulmonary ROS, former smoker,    Pulmonary exam normal breath sounds clear to auscultation       Cardiovascular Exercise Tolerance: Good hypertension, negative cardio ROS   Rhythm:regular Rate:Normal     Neuro/Psych PSYCHIATRIC DISORDERS Anxiety negative neurological ROS     GI/Hepatic negative GI ROS, Neg liver ROS,   Endo/Other  negative endocrine ROS  Renal/GU negative Renal ROS  negative genitourinary   Musculoskeletal   Abdominal   Peds  Hematology negative hematology ROS (+)   Anesthesia Other Findings   Reproductive/Obstetrics negative OB ROS                             Anesthesia Physical Anesthesia Plan  ASA: II  Anesthesia Plan: General   Post-op Pain Management:    Induction:   PONV Risk Score and Plan: Propofol infusion  Airway Management Planned:   Additional Equipment:   Intra-op Plan:   Post-operative Plan:   Informed Consent: I have reviewed the patients History and Physical, chart, labs and discussed the procedure including the risks, benefits and alternatives for the proposed anesthesia with the patient or authorized representative who has indicated his/her understanding and acceptance.     Dental Advisory Given  Plan Discussed with: CRNA  Anesthesia Plan Comments:         Anesthesia Quick Evaluation

## 2020-10-05 NOTE — Discharge Instructions (Signed)
EGD Discharge instructions Please read the instructions outlined below and refer to this sheet in the next few weeks. These discharge instructions provide you with general information on caring for yourself after you leave the hospital. Your doctor may also give you specific instructions. While your treatment has been planned according to the most current medical practices available, unavoidable complications occasionally occur. If you have any problems or questions after discharge, please call your doctor. ACTIVITY  You may resume your regular activity but move at a slower pace for the next 24 hours.   Take frequent rest periods for the next 24 hours.   Walking will help expel (get rid of) the air and reduce the bloated feeling in your abdomen.   No driving for 24 hours (because of the anesthesia (medicine) used during the test).   You may shower.   Do not sign any important legal documents or operate any machinery for 24 hours (because of the anesthesia used during the test).  NUTRITION  Drink plenty of fluids.   You may resume your normal diet.   Begin with a light meal and progress to your normal diet.   Avoid alcoholic beverages for 24 hours or as instructed by your caregiver.  MEDICATIONS  You may resume your normal medications unless your caregiver tells you otherwise.  WHAT YOU CAN EXPECT TODAY  You may experience abdominal discomfort such as a feeling of fullness or gas pains.  FOLLOW-UP  Your doctor will discuss the results of your test with you.  SEEK IMMEDIATE MEDICAL ATTENTION IF ANY OF THE FOLLOWING OCCUR:  Excessive nausea (feeling sick to your stomach) and/or vomiting.   Severe abdominal pain and distention (swelling).   Trouble swallowing.   Temperature over 101 F (37.8 C).   Rectal bleeding or vomiting of blood.    Your upper GI tract looked really good today.  You do have a small hiatal hernia which is not causing any problems and nothing needs to  be done about it  All of your symptoms may not be coming from your GI tract.  For now, stop Protonix; trial of Dexilant 60 mg daily x2 weeks -go by my office for 2-week supply of samples  Call us in 2 weeks and let us know if Dexilant has made any difference in your symptoms  Office visit with Lewie Loron in 4 weeks  At patient request, I called Zuhair Lariccia at [phone] -discussed findings and recommendations

## 2020-10-11 ENCOUNTER — Encounter (HOSPITAL_COMMUNITY): Payer: Self-pay | Admitting: Internal Medicine

## 2020-10-23 ENCOUNTER — Other Ambulatory Visit: Payer: Self-pay

## 2020-10-23 ENCOUNTER — Ambulatory Visit (INDEPENDENT_AMBULATORY_CARE_PROVIDER_SITE_OTHER): Payer: Self-pay | Admitting: Cardiology

## 2020-10-23 ENCOUNTER — Telehealth: Payer: Self-pay | Admitting: Internal Medicine

## 2020-10-23 ENCOUNTER — Encounter: Payer: Self-pay | Admitting: Cardiology

## 2020-10-23 VITALS — BP 140/86 | HR 63 | Ht 73.0 in | Wt 227.0 lb

## 2020-10-23 DIAGNOSIS — R1030 Lower abdominal pain, unspecified: Secondary | ICD-10-CM

## 2020-10-23 DIAGNOSIS — R1012 Left upper quadrant pain: Secondary | ICD-10-CM

## 2020-10-23 MED ORDER — ESOMEPRAZOLE MAGNESIUM 40 MG PO CPDR: 40.0000 mg | DELAYED_RELEASE_CAPSULE | Freq: Every day | ORAL | 3 refills | Status: DC

## 2020-10-23 NOTE — Telephone Encounter (Signed)
Spoke with pt. Pt was taking Pantoprazole 40 mg and was switched after having his procedure on 10/05/20 to Dexilant 60 mg. Pt feels Dexilant 60 mg isn't working. Pt is having pain off and on in his left upper and lower abdomen, pt feels he gets out of breath easily x months, pt also feels that when he lays down and takes a deep breath, he feels discomfort in his left lower abdomen. Pt would like to try a different medication. Pt has previously tried Omeprazole as well.

## 2020-10-23 NOTE — Telephone Encounter (Signed)
Helmut Muster, if he wants, I can order another CT but WITH contrast. Previously was without contrast earlier this year.

## 2020-10-23 NOTE — Telephone Encounter (Signed)
Spoke with pt. Pt was notified of trial of trial of Nexium and agrees to try medication. If medication is too expensive, pt will try otc or goodrx coupon. Pt would like to do the CT scan with contrast. Pt will also discuss the shortness of breath with his PCP.

## 2020-10-23 NOTE — Addendum Note (Signed)
Addended by: Gelene Mink on: 10/23/2020 04:21 PM   Modules accepted: Orders

## 2020-10-23 NOTE — Progress Notes (Signed)
Electrophysiology Office Note:    Date:  10/23/2020   ID:  [name], DOB 08/18/1986, MRN [id number]  PCP:  Jacquelin Hawking, PA-C  [facility] Cardiologist:  No primary care provider on file.  [facility] Electrophysiologist:  None   Referring MD: Jacquelin Hawking, PA-C   Chief Complaint: Abdominal pain  History of Present Illness:    Christopher Castro is a 34 y.o. male who presents for an evaluation of abdominal pain at the request of Jacquelin Hawking, PA-C. Their medical history includes substance abuse, hypertension, anxiety.  Patient tells me that for the last year he is experienced left-sided abdominal pain that is nearly constant.  He tells me that the pain is located in the left upper quadrant and goes directly to his back.  At times he also has a pain that is near his left collarbone.  The pain does have a positional component.  No syncope or presyncope.  No association to food.  No clear triggers.  He has tried multiple PPIs without improvement in his symptoms.  He seen a GI doctor recently who performed an upper endoscopy without any abnormalities.  He is referred to me to determine whether or not there is a cardiac component contributing to his symptoms.  Past Medical History:  Diagnosis Date  . Anxiety   . HTN (hypertension)   . Substance abuse (HCC)    remote past    Past Surgical History:  Procedure Laterality Date  . ESOPHAGOGASTRODUODENOSCOPY (EGD) WITH PROPOFOL N/A 10/05/2020   Procedure: ESOPHAGOGASTRODUODENOSCOPY (EGD) WITH PROPOFOL;  Surgeon: Corbin Ade, MD;  Location: [facility];  Service: Endoscopy;  Laterality: N/A;  12:15pm  . WISDOM TOOTH EXTRACTION      Current Medications: Current Meds  Medication Sig  . buprenorphine (SUBUTEX) 8 MG SUBL SL tablet Place 8 mg under the tongue daily.   . fluticasone (FLONASE) 50 MCG/ACT nasal spray Place 1 spray into both nostrils daily.  Marland Kitchen ibuprofen (ADVIL) 200 MG tablet Take 400-600 mg by mouth every 8  (eight) hours as needed for moderate pain.   . metoprolol tartrate (LOPRESSOR) 50 MG tablet Take 1 tablet (50 mg total) by mouth 2 (two) times daily.  . pseudoephedrine (SUDAFED) 120 MG 12 hr tablet Take 60 mg by mouth daily as needed for congestion.     Allergies:   Augmentin [amoxicillin-pot clavulanate]   Social History   Socioeconomic History  . Marital status: Single    Spouse name: Not on file  . Number of children: Not on file  . Years of education: Not on file  . Highest education level: Not on file  Occupational History  . Occupation: Lawyer  Tobacco Use  . Smoking status: Former Smoker    Packs/day: 0.75    Years: 18.00    Pack years: 13.50    Types: Cigarettes    Quit date: 07/11/2020    Years since quitting: 0.2  . Smokeless tobacco: Never Used  Vaping Use  . Vaping Use: Some days  Substance and Sexual Activity  . Alcohol use: Not Currently  . Drug use: Not Currently    Types: Opium    Comment: hx of opoid use (percocet/hydrocodone) last used 2017  . Sexual activity: Not on file  Other Topics Concern  . Not on file  Social History Narrative  . Not on file   Social Determinants of Health   Financial Resource Strain:   . Difficulty of Paying Living Expenses: Not on file  Food Insecurity:   .  Worried About Programme researcher, broadcasting/film/video in the Last Year: Not on file  . Ran Out of Food in the Last Year: Not on file  Transportation Needs:   . Lack of Transportation (Medical): Not on file  . Lack of Transportation (Non-Medical): Not on file  Physical Activity:   . Days of Exercise per Week: Not on file  . Minutes of Exercise per Session: Not on file  Stress:   . Feeling of Stress : Not on file  Social Connections:   . Frequency of Communication with Friends and Family: Not on file  . Frequency of Social Gatherings with Friends and Family: Not on file  . Attends Religious Services: Not on file  . Active Member of Clubs or Organizations: Not on file  .  Attends Banker Meetings: Not on file  . Marital Status: Not on file     Family History: The patient's family history includes Asthma in his brother and brother. There is no history of Colon cancer or Colon polyps.  ROS:   Please see the history of present illness.    All other systems reviewed and are negative.  EKGs/Labs/Other Studies Reviewed:    The following studies were reviewed today: Prior notes  EKG:  The ekg ordered today demonstrates sinus rhythm.  Normal EKG.  Recent Labs: 05/31/2020: ALT 31; BUN 11; Creatinine, Ser 0.86; Hemoglobin 14.1; Platelets 296; Potassium 3.9; Sodium 138  Recent Lipid Panel    Component Value Date/Time   CHOL 198 05/31/2020 1121   TRIG 120 05/31/2020 1121   HDL 35 (L) 05/31/2020 1121   CHOLHDL 5.7 05/31/2020 1121   VLDL 24 05/31/2020 1121   LDLCALC 139 (H) 05/31/2020 1121    Physical Exam:    VS:  BP 140/86   Pulse 63   Ht 6\' 1"  (1.854 m)   Wt 227 lb (103 kg)   SpO2 99%   BMI 29.95 kg/m     Wt Readings from Last 3 Encounters:  10/23/20 227 lb (103 kg)  10/05/20 225 lb (102.1 kg)  10/02/20 227 lb 11.8 oz (103.3 kg)     GEN:  Well nourished, well developed in no acute distress HEENT: Normal NECK: No JVD; No carotid bruits LYMPHATICS: No lymphadenopathy CARDIAC: RRR, no murmurs, rubs, gallops RESPIRATORY:  Clear to auscultation without rales, wheezing or rhonchi  ABDOMEN: Soft, non-tender, non-distended MUSCULOSKELETAL:  No edema; No deformity  SKIN: Warm and dry NEUROLOGIC:  Alert and oriented x 3 PSYCHIATRIC:  Normal affect   ASSESSMENT:    1. LUQ pain    PLAN:    In order of problems listed above:  1. Left upper quadrant pain Patient presents with a long history of left upper quadrant pain.  Given the chronicity of his pain and the location, I do not believe that there is a cardiac etiology for his discomfort.  His EKG is completely normal.  There is no exertional exacerbation of his discomfort.  No  syncope or presyncope.  I have advised him to follow-up closely with his primary care physician.  I do not think any additional cardiac testing is warranted at this time.   Medication Adjustments/Labs and Tests Ordered: Current medicines are reviewed at length with the patient today.  Concerns regarding medicines are outlined above.  Orders Placed This Encounter  Procedures  . EKG 12-Lead   No orders of the defined types were placed in this encounter.    Signed, 13/01/21, MD, [facility]  10/23/2020 3:07 PM  Electrophysiology [facility]

## 2020-10-23 NOTE — Telephone Encounter (Signed)
Patient called and said that the samples given to him after the procedure did not work, he can tell no difference.  Wants to know what he can try now.

## 2020-10-23 NOTE — Patient Instructions (Addendum)
Medication Instructions:  Your physician recommends that you continue on your current medications as directed. Please refer to the Current Medication list given to you today.  Labwork: None ordered.  Testing/Procedures: None ordered.  Follow-Up: Your physician wants you to follow-up in: as needed with Dr. Lambert.    Any Other Special Instructions Will Be Listed Below (If Applicable).  If you need a refill on your cardiac medications before your next appointment, please call your pharmacy.   

## 2020-10-23 NOTE — Telephone Encounter (Signed)
We can trial Nexium. May or may not be covered by insurance. I sent to [facility]. If not covered, can get OTC for a brief course. We could also provide GoodRx cards to help cut down cost.   Shortness of breath unrelated to GI I feel. He already has a CT on file from earlier this year. Please defer back to PCP for shortness of breath. Cardiology did not feel he needed further evaluation.

## 2020-10-24 NOTE — Telephone Encounter (Signed)
Noted  

## 2020-10-24 NOTE — Telephone Encounter (Signed)
CT abd/pelvis w/contrast scheduled for 11/21/20 at 5:00pm, arrive at 4:45pm. NPO 4 hours prior to test. Pickup contrast and have creatinine drawn prior to test. Creatinine order faxed to [facility] lab.  Tried to call pt, no answer, voicemail box full. Appt letter mailed.

## 2020-10-24 NOTE — Addendum Note (Signed)
Addended by: Corrie Mckusick on: 10/24/2020 07:55 AM   Modules accepted: Orders

## 2020-10-24 NOTE — Addendum Note (Signed)
Addended by: Corrie Mckusick on: 10/24/2020 08:01 AM   Modules accepted: Orders

## 2020-11-21 ENCOUNTER — Other Ambulatory Visit: Payer: Self-pay

## 2020-11-21 ENCOUNTER — Ambulatory Visit (HOSPITAL_COMMUNITY)
Admission: RE | Admit: 2020-11-21 | Discharge: 2020-11-21 | Disposition: A | Discharge status: Home / Self Care | Payer: Self-pay | Source: Ambulatory Visit | Attending: Gastroenterology | Admitting: Gastroenterology

## 2020-11-21 DIAGNOSIS — R1030 Lower abdominal pain, unspecified: Secondary | ICD-10-CM | POA: Insufficient documentation

## 2020-11-21 DIAGNOSIS — R1012 Left upper quadrant pain: Secondary | ICD-10-CM | POA: Insufficient documentation

## 2020-11-21 MED ORDER — IOHEXOL 300 MG/ML  SOLN
100.0000 mL | Freq: Once | INTRAMUSCULAR | Status: AC | PRN
  Administered 2020-11-21: 18:00:00 100 mL via INTRAVENOUS

## 2020-11-22 ENCOUNTER — Telehealth: Payer: Self-pay

## 2020-11-22 ENCOUNTER — Telehealth: Payer: Self-pay | Admitting: Internal Medicine

## 2020-11-22 DIAGNOSIS — R1030 Lower abdominal pain, unspecified: Secondary | ICD-10-CM

## 2020-11-22 DIAGNOSIS — R9341 Abnormal radiologic findings on diagnostic imaging of renal pelvis, ureter, or bladder: Secondary | ICD-10-CM

## 2020-11-22 LAB — POCT I-STAT CREATININE: Creatinine, Ser: 0.7 mg/dL (ref 0.61–1.24)

## 2020-11-22 NOTE — Telephone Encounter (Signed)
Noted  

## 2020-11-22 NOTE — Telephone Encounter (Signed)
Please tell the patient he has an abnormal appearance to the top of his bladder that could be a little pocket with a stone in it or (less likely) a small cancerous area. We need him to see a urologist to evaluate and determine what next steps need to be done. There is a urologist group [facility]) in [place] or we could send him to urology in [place] (it's the same group that has someone here in [place]). Let us know what he prefers.  When the patient decides where he wants to go, please put in an urgent referral to urology.  Cc: Lewie Loron for [facility] and follow-up

## 2020-11-22 NOTE — Telephone Encounter (Signed)
Received a call from the Radiology department. U/s was completed yesterday 11/21/20.   IMPRESSION: 1. 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 Recommend urologic consultation. 2. Possible mild hepatic steatosis.  These results will be called to the ordering clinician or representative by the Radiologist Assistant, and communication documented in the PACS or [employer].

## 2020-11-22 NOTE — Telephone Encounter (Signed)
Urgent referral sent to urology in [place] via Epic.

## 2020-11-22 NOTE — Telephone Encounter (Signed)
Spoke with pt. Pt was notified of results. Pt would like to be referred to urology in [place]- please arrange urgent referral.

## 2020-11-22 NOTE — Addendum Note (Signed)
Addended by: Corrie Mckusick on: 11/22/2020 01:11 PM   Modules accepted: Orders

## 2020-11-22 NOTE — Telephone Encounter (Signed)
Pt is VERY anxious to have the doctor call him and go over his results with him. Helmut Muster said she would forward message for Wynne Dust, NP to call patient. [phone] or [phone]

## 2020-11-22 NOTE — Telephone Encounter (Signed)
Called the patient and reviewed his CT in detail. Reassured that it is most likely a calcified stone in a diverticulum but urgent urology referral sent. He appreciated the time and will wait to hear about urology appointment.  Cc: Leota Sauers for [facility]

## 2020-11-28 NOTE — Telephone Encounter (Signed)
Appreciate help while I was out.

## 2020-11-29 ENCOUNTER — Other Ambulatory Visit: Payer: Self-pay | Admitting: Physician Assistant

## 2020-12-04 ENCOUNTER — Encounter: Payer: Self-pay | Admitting: Gastroenterology

## 2020-12-04 NOTE — Progress Notes (Unsigned)
Primary Care Physician:  Jacquelin Hawking, PA-C  Primary GI: Dr. Jena Gauss   Patient Location: Home   Provider Location: [facility] office   Reason for Visit: Follow-up   Persons present on the virtual encounter, with roles: Patient and NP   Total time (minutes) spent on medical discussion: 15 minutes   Due to COVID-19, visit was conducted using virtual method.  Visit was requested by patient.  Virtual Visit via MyChart Video Note Due to COVID-19, visit is conducted virtually and was requested by patient.   I connected with Christopher Castro on 12/05/20 at  9:00 AM EST by telephone and verified that I am speaking with the correct person using two identifiers.   I discussed the limitations, risks, security and privacy concerns of performing an evaluation and management service by telephone and the availability of in person appointments. I also discussed with the patient that there may be a patient responsible charge related to this service. The patient expressed understanding and agreed to proceed.  Chief Complaint  Patient presents with  . Follow-up    Fu from CT scan     History of Present Illness: 35 year old male with history of chronic LUQ pain, s/p EGD with normal esophagus, small hiatal hernia, otherwise normal. 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 He has been referred to Urology. Incidentally also noted mild hepatic steatosis.   Urology appt upcoming Jan 15th. Abdominal pain moving certain ways if stretching, or bending over. No postprandial abdominal pain. No N/V. No GERD. Prior to taking Nexium that we prescribed, he would wake with a raw feeling in stomach. Continues with Nexium daily but difficulty taking in the morning.   Past Medical History:  Diagnosis Date  . Anxiety   . HTN (hypertension)    . Substance abuse (HCC)    remote past     Past Surgical History:  Procedure Laterality Date  . ESOPHAGOGASTRODUODENOSCOPY (EGD) WITH PROPOFOL N/A 10/05/2020   normal esophagus, small hiatal hernia, otherwise normal  . WISDOM TOOTH EXTRACTION       Current Meds  Medication Sig  . buprenorphine (SUBUTEX) 8 MG SUBL SL tablet Place 8 mg under the tongue daily.   Marland Kitchen esomeprazole (NEXIUM) 40 MG capsule Take 1 capsule (40 mg total) by mouth daily before breakfast.  . fluticasone (FLONASE) 50 MCG/ACT nasal spray Place 1 spray into both nostrils daily.  Marland Kitchen ibuprofen (ADVIL) 200 MG tablet Take 400-600 mg by mouth as needed for moderate pain.  . metoprolol tartrate (LOPRESSOR) 50 MG tablet Take 1 tablet by mouth twice daily     Family History  Problem Relation Age of Onset  . Asthma Brother   . Asthma Brother   . Colon cancer Neg Hx   . Colon polyps Neg Hx     Social History   Socioeconomic History  . Marital status: Single    Spouse name: Not on file  . Number of children: Not on file  . Years of education: Not on file  . Highest education level: Not on file  Occupational History  . Occupation: Lawyer  Tobacco Use  . Smoking status: Former Smoker    Packs/day: 0.75    Years: 18.00    Pack years: 13.50    Types: Cigarettes    Quit date: 07/11/2020    Years since quitting: 0.4  . Smokeless  tobacco: Never Used  Vaping Use  . Vaping Use: Some days  Substance and Sexual Activity  . Alcohol use: Not Currently  . Drug use: Not Currently    Types: Opium    Comment: hx of opoid use (percocet/hydrocodone) last used 2017  . Sexual activity: Not on file  Other Topics Concern  . Not on file  Social History Narrative  . Not on file   Social Determinants of Health   Financial Resource Strain: Not on file  Food Insecurity: Not on file  Transportation Needs: Not on file  Physical Activity: Not on file  Stress: Not on file  Social Connections: Not on file        Review of Systems: Gen: Denies fever, chills, anorexia. Denies fatigue, weakness, weight loss.  CV: Denies chest pain, palpitations, syncope, peripheral edema, and claudication. Resp: Denies dyspnea at rest, cough, wheezing, coughing up blood, and pleurisy. GI: see HPI Derm: Denies rash, itching, dry skin Psych: Denies depression, anxiety, memory loss, confusion. No homicidal or suicidal ideation.  Heme: Denies bruising, bleeding, and enlarged lymph nodes.  Observations/Objective: No distress. Unable to perform physical exam due to telephone encounter.   Assessment and Plan: 35 year old male with chronic LUQ pain, s/p EGD that was essentially unrevealing. Due to persistent symptoms, CT was then updated showing 2 mm calcification within a midline supravesicular diverticulum, differentials including urachal carcinoma vs calcified stone within a diverticulum. Encouragingly, Urology appt is on Jan 15th.  LUQ pain appears musculoskeletal in nature. Unclear if CT findings could be contributing.   Nexium has worked well for "unsettled" stomach in morning. Likely atypical GERD symptoms. We discussed taking Nexium 30 minutes before dinner, as he has had difficulty taking in the mornings.   Will see him in 4 months. Keep Urology appt. Further musculoskeletal assessment per PCP.   Follow Up Instructions:    I discussed the assessment and treatment plan with the patient. The patient was provided an opportunity to ask questions and all were answered. The patient agreed with the plan and demonstrated an understanding of the instructions.   The patient was advised to call back or seek an in-person evaluation if the symptoms worsen or if the condition fails to improve as anticipated.  I provided 15 minutes of face-to-face time during this MyChart Video encounter.  Gelene Mink, PhD, ANP-BC [facility]

## 2020-12-05 ENCOUNTER — Telehealth (INDEPENDENT_AMBULATORY_CARE_PROVIDER_SITE_OTHER): Payer: Self-pay | Admitting: Gastroenterology

## 2020-12-05 ENCOUNTER — Encounter: Payer: Self-pay | Admitting: Internal Medicine

## 2020-12-05 ENCOUNTER — Telehealth: Payer: Self-pay | Admitting: *Deleted

## 2020-12-05 ENCOUNTER — Encounter: Payer: Self-pay | Admitting: Gastroenterology

## 2020-12-05 DIAGNOSIS — R1012 Left upper quadrant pain: Secondary | ICD-10-CM

## 2020-12-05 NOTE — Patient Instructions (Signed)
Continue Nexium daily. You can take this 30 minutes before dinner if this works best.  Keep appointment upcoming with Urology,and let me know how it goes!  I believe some of this discomfort is musculoskeletal. Please reach out to PCP regarding this.   We will see you in 3-4 months!  I enjoyed seeing you again today! As you know, I value our relationship and want to provide genuine, compassionate, and quality care. I welcome your feedback. If you receive a survey regarding your visit,  I greatly appreciate you taking time to fill this out. See you next time!  Gelene Mink, PhD, ANP-BC [facility]

## 2020-12-05 NOTE — Telephone Encounter (Signed)
Pt consented to a virtual visit. 

## 2020-12-05 NOTE — Telephone Encounter (Signed)
Christopher Castro, you are scheduled for a virtual visit with your provider today.  Just as we do with appointments in the office, we must obtain your consent to participate.  Your consent will be active for this visit and any virtual visit you may have with one of our providers in the next 365 days.  If you have a MyChart account, I can also send a copy of this consent to you electronically.  All virtual visits are billed to your insurance company just like a traditional visit in the office.  As this is a virtual visit, video technology does not allow for your provider to perform a traditional examination.  This may limit your provider's ability to fully assess your condition.  If your provider identifies any concerns that need to be evaluated in person or the need to arrange testing such as labs, EKG, etc, we will make arrangements to do so.  Although advances in technology are sophisticated, we cannot ensure that it will always work on either your end or our end.  If the connection with a video visit is poor, we may have to switch to a telephone visit.  With either a video or telephone visit, we are not always able to ensure that we have a secure connection.   I need to obtain your verbal consent now.   Are you willing to proceed with your visit today?

## 2020-12-05 NOTE — Progress Notes (Signed)
Cc'ed to pcp °

## 2020-12-12 ENCOUNTER — Ambulatory Visit (INDEPENDENT_AMBULATORY_CARE_PROVIDER_SITE_OTHER): Payer: Self-pay | Admitting: Urology

## 2020-12-12 ENCOUNTER — Other Ambulatory Visit: Payer: Self-pay

## 2020-12-12 ENCOUNTER — Encounter: Payer: Self-pay | Admitting: Urology

## 2020-12-12 VITALS — BP 153/79 | HR 72 | Temp 98.8°F

## 2020-12-12 DIAGNOSIS — R350 Frequency of micturition: Secondary | ICD-10-CM

## 2020-12-12 DIAGNOSIS — N3289 Other specified disorders of bladder: Secondary | ICD-10-CM | POA: Insufficient documentation

## 2020-12-12 LAB — URINALYSIS, ROUTINE W REFLEX MICROSCOPIC
Bilirubin, UA: NEGATIVE
Glucose, UA: NEGATIVE
Ketones, UA: NEGATIVE
Leukocytes,UA: NEGATIVE
Nitrite, UA: NEGATIVE
RBC, UA: NEGATIVE
Specific Gravity, UA: 1.02 (ref 1.005–1.030)
Urobilinogen, Ur: 0.2 mg/dL (ref 0.2–1.0)
pH, UA: 7.5 (ref 5.0–7.5)

## 2020-12-12 LAB — MICROSCOPIC EXAMINATION
Bacteria, UA: NONE SEEN
Epithelial Cells (non renal): NONE SEEN /hpf (ref 0–10)
RBC, Urine: NONE SEEN /hpf (ref 0–2)
Renal Epithel, UA: NONE SEEN /hpf
WBC, UA: NONE SEEN /hpf (ref 0–5)

## 2020-12-12 MED ORDER — CIPROFLOXACIN HCL 500 MG PO TABS
500.0000 mg | ORAL_TABLET | Freq: Once | ORAL | Status: AC
  Administered 2020-12-12: 500 mg via ORAL

## 2020-12-12 NOTE — Patient Instructions (Signed)
Bladder Biopsy A bladder biopsy is a procedure to remove a small sample of tissue from the bladder. The procedure is done so that the tissue can be examined under a microscope. You may have a bladder biopsy to diagnose or rule out cancer of the bladder. During a bladder biopsy, your health care provider may insert a long, thin scope with a lighted camera (cystoscope) into the urethra and move it into your bladder. The cystoscope will allow your health care provider to check the lining of the urethra and bladder and remove the tissue sample. If your health care provider also needs to check your ureters, a longer tube (ureteroscope) may be used. Tell your health care provider about:  Any allergies you have.  All medicines you are taking, including vitamins, herbs, eye drops, creams, and over-the-counter medicines.  Any problems you or family members have had with anesthetic medicines.  Any blood disorders you have.  Any surgeries you have had.  Any medical conditions you have.  Whether you are pregnant or may be pregnant. What are the risks? Generally, this is a safe procedure. However, problems may occur, including:  Bleeding.  Infection, especially a urinary tract infection (UTI).  Allergic reactions to medicines.  Damage to nearby structures or organs, including the urethra, bladder, or ureters.  Abdominal pain.  Burning or pain during urination.  Narrowing of the urethra due to scar tissue.  Difficulty urinating due to swelling. What happens before the procedure? Medicines Ask your health care provider about:  Changing or stopping your regular medicines. This is especially important if you are taking diabetes medicines or blood thinners.  Taking medicines such as aspirin and ibuprofen. These medicines can thin your blood. Do not take these medicines unless your health care provider tells you to take them.  Taking over-the-counter medicines, vitamins, herbs, and  supplements. Surgery safety Ask your health care provider:  How your surgery site will be marked.  What steps will be taken to help prevent infection. These may include: ? Removing hair at the surgery site. ? Washing skin with a germ-killing soap. ? Receiving antibiotic medicine. General instructions  Follow instructions from your health care provider about eating and drinking restrictions.  You may be asked to drink plenty of fluids.  You may be asked to urinate right before the procedure. You may have a urine sample taken for UTI testing.  Plan to have someone take you home from the hospital or clinic.  If you will be going home right after the procedure, plan to have someone with you for 24 hours. What happens during the procedure?  An IV may be inserted into one of your veins.  You may be given one or more of the following: ? A medicine to help you relax (sedative). ? A medicine to numb the opening of the urethra (local anesthetic). ? A medicine to make you fall asleep (general anesthetic).  You will lie on your back with your knees bent and spread apart.  The cystoscope or ureteroscope will be inserted into your urethra and guided into your bladder or ureters.  Your bladder may be slowly filled with germ-free (sterile) water. This will make it easier for your health care provider to view the wall or lining of your bladder.  Small instruments will be inserted through the scope to collect a small tissue sample that will be examined under a microscope. The procedure may vary among health care providers and hospitals.   What happens after the procedure?    Your blood pressure, heart rate, breathing rate, and blood oxygen level will be monitored until you leave the hospital or clinic.  You may be asked to empty your bladder, or your bladder may be emptied for you.  Do not drive for 24 hours if you received a sedative. Summary  A bladder biopsy is a procedure to remove a  small sample of tissue from the bladder.  You may have a bladder biopsy to diagnose or rule out cancer of the bladder.  Follow instructions from your health care provider about eating and drinking restrictions.  Ask your health care provider if you need to stop or change any medicines that you are taking.  If you will be going home right after the procedure, plan to have someone with you for 24 hours. This information is not intended to replace advice given to you by your health care provider. Make sure you discuss any questions you have with your health care provider. Document Revised: 05/26/2019 Document Reviewed: 05/26/2019 Elsevier Patient Education  2021 [employer] Inc[employer]  

## 2020-12-12 NOTE — Progress Notes (Signed)
Urological Symptom Review  Patient is experiencing the following symptoms: Get up at night to urinate Stream starts and stops   Review of Systems  Gastrointestinal (upper)  : Indigestion/heartburn  Gastrointestinal (lower) : Negative for lower GI symptoms  Constitutional : Fatigue  Skin: Negative for skin symptoms  Eyes: Negative for eye symptoms  Ear/Nose/Throat : Sinus problems  Hematologic/Lymphatic: Negative for Hematologic/Lymphatic symptoms  Cardiovascular : Chest pain  Respiratory : Negative for respiratory symptoms  Endocrine: Negative for endocrine symptoms  Musculoskeletal: Negative for musculoskeletal symptoms  Neurological: Negative for neurological symptoms  Psychologic: Negative for psychiatric symptoms

## 2020-12-12 NOTE — Progress Notes (Signed)
12/12/2020 5:07 PM   Christopher Castro 02-06-1986 [id number]  Referring provider: Jacquelin Hawking, PA-C [address] [place],  [place] [address]  Bladder mass  HPI: Christopher Castro is a 35yo here for evaluation of urinary frequency and a bladder mass. He has been having worsening urinary frequency with associated urinary urgency for 5 months. He has suprapubic pain that has been present for 4 months. He underwent CT in Dec 2021 for his abdominal pain and was found to have a 1cm cystic mass at the dome of the bladder with a 34mm calcification. No other associated symptoms.    PMH: Past Medical History:  Diagnosis Date  . Anxiety   . HTN (hypertension)   . Substance abuse (HCC)    remote past    Surgical History: Past Surgical History:  Procedure Laterality Date  . ESOPHAGOGASTRODUODENOSCOPY (EGD) WITH PROPOFOL N/A 10/05/2020   normal esophagus, small hiatal hernia, otherwise normal  . WISDOM TOOTH EXTRACTION      Home Medications:  Allergies as of 12/12/2020      Reactions   Augmentin [amoxicillin-pot Clavulanate] Nausea And Vomiting      Medication List       Accurate as of December 12, 2020  5:07 PM. If you have any questions, ask your nurse or doctor.        STOP taking these medications   fluticasone 50 MCG/ACT nasal spray Commonly known as: FLONASE Stopped by: Wilkie Aye, MD   ibuprofen 200 MG tablet Commonly known as: ADVIL Stopped by: Wilkie Aye, MD   pseudoephedrine 120 MG 12 hr tablet Commonly known as: SUDAFED Stopped by: Wilkie Aye, MD     TAKE these medications   buprenorphine 8 MG Subl SL tablet Commonly known as: SUBUTEX Place 8 mg under the tongue daily.   esomeprazole 40 MG capsule Commonly known as: NexIUM Take 1 capsule (40 mg total) by mouth daily before breakfast.   metoprolol tartrate 50 MG tablet Commonly known as: LOPRESSOR Take 1 tablet by mouth twice daily       Allergies:  Allergies  Allergen Reactions  .  Augmentin [Amoxicillin-Pot Clavulanate] Nausea And Vomiting    Family History: Family History  Problem Relation Age of Onset  . Asthma Brother   . Asthma Brother   . Colon cancer Neg Hx   . Colon polyps Neg Hx     Social History:  reports that he quit smoking about 5 months ago. His smoking use included cigarettes. He has a 13.50 pack-year smoking history. He has never used smokeless tobacco. He reports previous alcohol use. He reports previous drug use. Drug: Opium.  ROS: All other review of systems were reviewed and are negative except what is noted above in HPI  Physical Exam: BP (!) 153/79   Pulse 72   Temp 98.8 F (37.1 C)   Constitutional:  Alert and oriented, No acute distress. HEENT:  AT, moist mucus membranes.  Trachea midline, no masses. Cardiovascular: No clubbing, cyanosis, or edema. Respiratory: Normal respiratory effort, no increased work of breathing. GI: Abdomen is soft, nontender, nondistended, no abdominal masses GU: No CVA tenderness.  Lymph: No cervical or inguinal lymphadenopathy. Skin: No rashes, bruises or suspicious lesions. Neurologic: Grossly intact, no focal deficits, moving all 4 extremities. Psychiatric: Normal mood and affect.  Laboratory Data: Lab Results  Component Value Date   WBC 6.2 05/31/2020   HGB 14.1 05/31/2020   HCT 42.4 05/31/2020   MCV 88.9 05/31/2020   PLT 296 05/31/2020  Lab Results  Component Value Date   CREATININE 0.70 11/21/2020    No results found for: PSA  No results found for: TESTOSTERONE  Lab Results  Component Value Date   HGBA1C 5.5 05/31/2020    Urinalysis    Component Value Date/Time   APPEARANCEUR Clear 12/12/2020 1413   GLUCOSEU Negative 12/12/2020 1413   BILIRUBINUR Negative 12/12/2020 1413   PROTEINUR Trace (A) 12/12/2020 1413   NITRITE Negative 12/12/2020 1413   LEUKOCYTESUR Negative 12/12/2020 1413    Lab Results  Component Value Date   LABMICR See below: 12/12/2020   WBCUA None  seen 12/12/2020   LABEPIT None seen 12/12/2020   BACTERIA None seen 12/12/2020    Pertinent Imaging: CT 11/21/2020: Images reviewed and discussed with the patient No results found for this or any previous visit.  No results found for this or any previous visit.  No results found for this or any previous visit.  No results found for this or any previous visit.  No results found for this or any previous visit.  No results found for this or any previous visit.  No results found for this or any previous visit.  No results found for this or any previous visit.   Assessment & Plan:    1. Bladder mass We discussed the management including cystoscopy, bladder biopsy and after discussing the procedure the patient wishes to proceed with surgery. Risks/benefits/alternatives discussed. - Urinalysis, Routine w reflex microscopic - ciprofloxacin (CIPRO) tablet 500 mg  2. Urinary frequency: -We will trial flomax 0.4mg  daily   No follow-ups on file.  Wilkie Aye, MD     Cystoscopy Procedure Note  Patient identification was confirmed, informed consent was obtained, and patient was prepped using Betadine solution.  Lidocaine jelly was administered per urethral meatus.     Pre-Procedure: - Inspection reveals a normal caliber ureteral meatus.  Procedure: The flexible cystoscope was introduced without difficulty - No urethral strictures/lesions are present. - Normal prostate  - Normal bladder neck - Bilateral ureteral orifices identified - 1cm cystic structure at the dome of the bladder - No bladder stones - No trabeculation   Post-Procedure: - Patient tolerated the procedure well

## 2020-12-26 ENCOUNTER — Other Ambulatory Visit: Payer: Self-pay | Admitting: Physician Assistant

## 2020-12-26 DIAGNOSIS — E785 Hyperlipidemia, unspecified: Secondary | ICD-10-CM

## 2020-12-26 DIAGNOSIS — I10 Essential (primary) hypertension: Secondary | ICD-10-CM

## 2021-01-03 ENCOUNTER — Ambulatory Visit: Payer: Self-pay | Admitting: Physician Assistant

## 2021-01-03 ENCOUNTER — Encounter: Payer: Self-pay | Admitting: Physician Assistant

## 2021-01-03 DIAGNOSIS — R519 Headache, unspecified: Secondary | ICD-10-CM

## 2021-01-03 DIAGNOSIS — R52 Pain, unspecified: Secondary | ICD-10-CM

## 2021-01-03 DIAGNOSIS — R5383 Other fatigue: Secondary | ICD-10-CM

## 2021-01-03 DIAGNOSIS — Z20822 Contact with and (suspected) exposure to covid-19: Secondary | ICD-10-CM

## 2021-01-03 DIAGNOSIS — J029 Acute pharyngitis, unspecified: Secondary | ICD-10-CM

## 2021-01-03 NOTE — Progress Notes (Signed)
There were no vitals taken for this visit.   Subjective:    Patient ID: Christopher Castro, male    DOB: 1986/08/21, 35 y.o.   MRN: [id number]  HPI: Christopher Castro is a 35 y.o. male presenting on 01/03/2021 for No chief complaint on file.   HPI    This is a telemedicine appointment through [employer] due to coronavirus pandemic.    I connected with  Christopher Castro on 01/03/21 by a video enabled telemedicine application and verified that I am speaking with the correct person using two identifiers.   I discussed the limitations of evaluation and management by telemedicine. The patient expressed understanding and agreed to proceed.  Pt is at home.  Provider is at office.    Pt is Sick.  he started feeling bad yesterday.  He had covid Exposure last week.  He has subjective fever, body aches, fatigue, HA, sore throat.  He does not have cough or n/v.   He got 1 dose of covid vaccination back in September.      Relevant past medical, surgical, family and social history reviewed and updated as indicated. Interim medical history since our last visit reviewed. Allergies and medications reviewed and updated.     Current Outpatient Medications:  .  buprenorphine (SUBUTEX) 8 MG SUBL SL tablet, Place 8 mg under the tongue daily. , Disp: , Rfl:  .  esomeprazole (NEXIUM) 40 MG capsule, Take 1 capsule (40 mg total) by mouth daily before breakfast., Disp: 30 capsule, Rfl: 3 .  fluticasone (FLONASE) 50 MCG/ACT nasal spray, Place into both nostrils daily., Disp: , Rfl:  .  metoprolol tartrate (LOPRESSOR) 50 MG tablet, Take 1 tablet by mouth twice daily, Disp: 60 tablet, Rfl: 1\    Review of Systems  Per HPI unless specifically indicated above     Objective:    There were no vitals taken for this visit.  Wt Readings from Last 3 Encounters:  10/23/20 227 lb (103 kg)  10/05/20 225 lb (102.1 kg)  10/02/20 227 lb 11.8 oz (103.3 kg)    Physical Exam Constitutional:      General: He is not  in acute distress.    Appearance: He is not toxic-appearing.     Comments: Pt looks like he doesn't feel well.  HENT:     Head: Normocephalic and atraumatic.  Pulmonary:     Effort: No respiratory distress.     Comments: Pt is speaking in complete sentences without dyspnea.  Neurological:     Mental Status: He is alert and oriented to person, place, and time.  Psychiatric:        Attention and Perception: Attention normal.        Speech: Speech normal.        Behavior: Behavior is cooperative.           Assessment & Plan:    Encounter Diagnoses  Name Primary?  . Suspected COVID-19 virus infection Yes  . Body aches   . Sore throat   . Acute nonintractable headache, unspecified headache type   . Fatigue, unspecified type     -discussed testing options.  Pt will go to Testing at [facility] parking lot testing site today -he is counseled on self-Isolation until he gets his test results -pt is asked to Call office with his test result as it is not entered into Epic -pt is encouraged to Rest, get plenty of fluids, use OTCs prn,  warm salt water gargles for his  ST -His appointment for 01/08/21 was rescheduled

## 2021-01-04 ENCOUNTER — Telehealth: Payer: Self-pay | Admitting: Student

## 2021-01-04 NOTE — Telephone Encounter (Signed)
Pt called the office and notified nurse he did an at home COVID test yesterday and tested POSITIVE.  instructions given to pt: " remain in self-quarantine until they meet the "Non-Test Criteria for Ending Self-Isolation". Non-Test Criteria for Ending Self-Isolation All persons with fever and respiratory symptoms should isolate themselves until ALL conditions listed below are met: " at least 5 days since symptoms onset " AND 3 consecutive days fever free without antipyretics (acetaminophen [Tylenol] or ibuprofen [Advil]) " AND improvement in respiratory symptoms " If the patient develops respiratory issues/distress, seek medical care in the Emergency Department, call 911, reports symptoms and report COVID-19 positive test. Patient Instructions " continue to utilize over-the-counter medications for fever (ibuprofen and/or Tylenol) and cough (cough medicine and/or sore throat lozenges). " wear a mask around people and follow good infection prevention techniques. " Patient to should only leave home to seek medical care. " send family for food, prescriptions or medicines; or use delivery service.  " If the patient must leave the home, they MUST wear a mask in public. " limit contact with immediate family members or caregivers in the home, and use mask, social distancing, and handwashing to decrease risk to patients. o Please continue good preventive care measures, including frequent hand washing, avoid touching your face, cover coughs/sneezes with tissue or into elbow, stay out of crowds and keep a 6-foot distance from others.   " patient and family to clean hard surfaces touched by patient frequently with household cleaning products. Pt verbalized understanding.

## 2021-01-08 ENCOUNTER — Ambulatory Visit: Payer: Self-pay | Admitting: Physician Assistant

## 2021-01-11 DIAGNOSIS — N3289 Other specified disorders of bladder: Secondary | ICD-10-CM

## 2021-01-19 NOTE — Patient Instructions (Signed)
EAIN MULLENDORE  01/19/2021     @PREFPERIOPPHARMACY @   Your procedure is scheduled on  01/25/2021.   Report to 01/27/2021 at  0920  A.M.   Call this number if you have problems the morning of surgery:  [phone]   Remember:  Do not eat or drink after midnight.                       Take these medicines the morning of surgery with A SIP OF WATER  Subutex, nexium, metoprolol.    Please brush your teeth.  Do not wear jewelry, make-up or nail polish.  Do not wear lotions, powders, or perfumes, or deodorant.  Do not shave 48 hours prior to surgery.  Men may shave face and neck.  Do not bring valuables to the hospital.  [facility] is not responsible for any belongings or valuables.  Contacts, dentures or bridgework may not be worn into surgery.  Leave your suitcase in the car.  After surgery it may be brought to your room.  For patients admitted to the hospital, discharge time will be determined by your treatment team.  Patients discharged the day of surgery will not be allowed to drive home and must have someone with them for 24 hours.    Special instructions:   DO NOT smoke tobacco or vape the morning of your procedure.  Please read over the following fact sheets that you were given. Coughing and Deep Breathing, Surgical Site Infection Prevention, Anesthesia Post-op Instructions and Care and Recovery After Surgery       Cystoscopy Cystoscopy is a procedure that is used to help diagnose and sometimes treat conditions that affect the lower urinary tract. The lower urinary tract includes the bladder and the urethra. The urethra is the tube that drains urine from the bladder. Cystoscopy is done using a thin, tube-shaped instrument with a light and camera at the end (cystoscope). The cystoscope may be hard or flexible, depending on the goal of the procedure. The cystoscope is inserted through the urethra, into the bladder. Cystoscopy may be recommended if you  have:  Urinary tract infections that keep coming back.  Blood in the urine (hematuria).  An inability to control when you urinate (urinary incontinence) or an overactive bladder.  Unusual cells found in a urine sample.  A blockage in the urethra, such as a urinary stone.  Painful urination.  An abnormality in the bladder found during an intravenous pyelogram (IVP) or CT scan. Cystoscopy may also be done to remove a sample of tissue to be examined under a microscope (biopsy). Tell a health care provider about:  Any allergies you have.  All medicines you are taking, including vitamins, herbs, eye drops, creams, and over-the-counter medicines.  Any problems you or family members have had with anesthetic medicines.  Any blood disorders you have.  Any surgeries you have had.  Any medical conditions you have.  Whether you are pregnant or may be pregnant. What are the risks? Generally, this is a safe procedure. However, problems may occur, including:  Infection.  Bleeding.  Allergic reactions to medicines.  Damage to other structures or organs. What happens before the procedure? Medicines Ask your health care provider about:  Changing or stopping your regular medicines. This is especially important if you are taking diabetes medicines or blood thinners.  Taking medicines such as aspirin and ibuprofen. These medicines can thin your blood. Do not take  these medicines unless your health care provider tells you to take them.  Taking over-the-counter medicines, vitamins, herbs, and supplements. Tests You may have an exam or testing, such as:  X-rays of the bladder, urethra, or kidneys.  CT scan of the abdomen or pelvis.  Urine tests to check for signs of infection. General instructions  Follow instructions from your health care provider about eating or drinking restrictions.  Ask your health care provider what steps will be taken to help prevent infection. These  steps may include: ? Washing skin with a germ-killing soap. ? Taking antibiotic medicine.  Plan to have a responsible adult take you home from the hospital or clinic. What happens during the procedure?  You will be given one or more of the following: ? A medicine to help you relax (sedative). ? A medicine to numb the area (local anesthetic).  The area around the opening of your urethra will be cleaned.  The cystoscope will be passed through your urethra into your bladder.  Germ-free (sterile) fluid will flow through the cystoscope to fill your bladder. The fluid will stretch your bladder so that your health care provider can clearly examine your bladder walls.  Your doctor will look at the urethra and bladder. Your doctor may take a biopsy or remove stones.  The cystoscope will be removed, and your bladder will be emptied. The procedure may vary among health care providers and hospitals.   What can I expect after the procedure? After the procedure, it is common to have:  Some soreness or pain in your abdomen and urethra.  Urinary symptoms. These include: ? Mild pain or burning when you urinate. Pain should stop within a few minutes after you urinate. This may last for up to 1 week. ? A small amount of blood in your urine for several days. ? Feeling like you need to urinate but producing only a small amount of urine. Follow these instructions at home: Medicines  Take over-the-counter and prescription medicines only as told by your health care provider.  If you were prescribed an antibiotic medicine, take it as told by your health care provider. Do not stop taking the antibiotic even if you start to feel better. General instructions  Return to your normal activities as told by your health care provider. Ask your health care provider what activities are safe for you.  If you were given a sedative during the procedure, it can affect you for several hours. Do not drive or operate  machinery until your health care provider says that it is safe.  Watch for any blood in your urine. If the amount of blood in your urine increases, call your health care provider.  Follow instructions from your health care provider about eating or drinking restrictions.  If a tissue sample was removed for testing (biopsy) during your procedure, it is up to you to get your test results. Ask your health care provider, or the department that is doing the test, when your results will be ready.  Drink enough fluid to keep your urine pale yellow.  Keep all follow-up visits. This is important. Contact a health care provider if:  You have pain that gets worse or does not get better with medicine, especially pain when you urinate.  You have trouble urinating.  You have more blood in your urine. Get help right away if:  You have blood clots in your urine.  You have abdominal pain.  You have a fever or chills.  You  are unable to urinate. Summary  Cystoscopy is a procedure that is used to help diagnose and sometimes treat conditions that affect the lower urinary tract.  Cystoscopy is done using a thin, tube-shaped instrument with a light and camera at the end.  After the procedure, it is common to have some soreness or pain in your abdomen and urethra.  Watch for any blood in your urine. If the amount of blood in your urine increases, call your health care provider.  If you were prescribed an antibiotic medicine, take it as told by your health care provider. Do not stop taking the antibiotic even if you start to feel better. This information is not intended to replace advice given to you by your health care provider. Make sure you discuss any questions you have with your health care provider. Document Revised: 06/30/2020 Document Reviewed: 06/30/2020 Elsevier Patient Education  2021 [employer] Monitored Anesthesia Care, Care After This sheet gives you information about how to care  for yourself after your procedure. Your health care provider may also give you more specific instructions. If you have problems or questions, contact your health care provider. What can I expect after the procedure? After the procedure, it is common to have:  Tiredness.  Forgetfulness about what happened after the procedure.  Impaired judgment for important decisions.  Nausea or vomiting.  Some difficulty with balance. Follow these instructions at home: For the time period you were told by your health care provider:  Rest as needed.  Do not participate in activities where you could fall or become injured.  Do not drive or use machinery.  Do not drink alcohol.  Do not take sleeping pills or medicines that cause drowsiness.  Do not make important decisions or sign legal documents.  Do not take care of children on your own.      Eating and drinking  Follow the diet that is recommended by your health care provider.  Drink enough fluid to keep your urine pale yellow.  If you vomit: ? Drink water, juice, or soup when you can drink without vomiting. ? Make sure you have little or no nausea before eating solid foods. General instructions  Have a responsible adult stay with you for the time you are told. It is important to have someone help care for you until you are awake and alert.  Take over-the-counter and prescription medicines only as told by your health care provider.  If you have sleep apnea, surgery and certain medicines can increase your risk for breathing problems. Follow instructions from your health care provider about wearing your sleep device: ? Anytime you are sleeping, including during daytime naps. ? While taking prescription pain medicines, sleeping medicines, or medicines that make you drowsy.  Avoid smoking.  Keep all follow-up visits as told by your health care provider. This is important. Contact a health care provider if:  You keep feeling  nauseous or you keep vomiting.  You feel light-headed.  You are still sleepy or having trouble with balance after 24 hours.  You develop a rash.  You have a fever.  You have redness or swelling around the IV site. Get help right away if:  You have trouble breathing.  You have new-onset confusion at home. Summary  For several hours after your procedure, you may feel tired. You may also be forgetful and have poor judgment.  Have a responsible adult stay with you for the time you are told. It is important to have someone  help care for you until you are awake and alert.  Rest as told. Do not drive or operate machinery. Do not drink alcohol or take sleeping pills.  Get help right away if you have trouble breathing, or if you suddenly become confused. This information is not intended to replace advice given to you by your health care provider. Make sure you discuss any questions you have with your health care provider. Document Revised: 08/03/2020 Document Reviewed: 10/21/2019 Elsevier Patient Education  2021 [employer].

## 2021-01-23 ENCOUNTER — Other Ambulatory Visit (HOSPITAL_COMMUNITY)
Admission: RE | Admit: 2021-01-23 | Discharge: 2021-01-23 | Disposition: A | Discharge status: Home / Self Care | Payer: Self-pay | Source: Ambulatory Visit | Attending: Urology | Admitting: Urology

## 2021-01-23 ENCOUNTER — Encounter (HOSPITAL_COMMUNITY)
Admission: RE | Admit: 2021-01-23 | Discharge: 2021-01-23 | Disposition: A | Discharge status: Home / Self Care | Payer: Self-pay | Source: Ambulatory Visit | Attending: Urology | Admitting: Urology

## 2021-01-23 ENCOUNTER — Other Ambulatory Visit: Payer: Self-pay

## 2021-01-23 DIAGNOSIS — Z01812 Encounter for preprocedural laboratory examination: Secondary | ICD-10-CM | POA: Insufficient documentation

## 2021-01-23 DIAGNOSIS — Z20822 Contact with and (suspected) exposure to covid-19: Secondary | ICD-10-CM | POA: Insufficient documentation

## 2021-01-23 LAB — RAPID URINE DRUG SCREEN, HOSP PERFORMED
Amphetamines: NOT DETECTED
Barbiturates: NOT DETECTED
Benzodiazepines: NOT DETECTED
Cocaine: NOT DETECTED
Opiates: NOT DETECTED
Tetrahydrocannabinol: NOT DETECTED

## 2021-01-23 LAB — SARS CORONAVIRUS 2 (TAT 6-24 HRS): SARS Coronavirus 2: NEGATIVE

## 2021-01-24 ENCOUNTER — Ambulatory Visit: Payer: Self-pay | Admitting: Urology

## 2021-01-25 ENCOUNTER — Encounter (HOSPITAL_COMMUNITY): Payer: Self-pay | Admitting: Urology

## 2021-01-25 ENCOUNTER — Encounter (HOSPITAL_COMMUNITY)
Admission: RE | Disposition: A | Discharge status: Home / Self Care | Payer: Self-pay | Source: Home / Self Care | Attending: Urology

## 2021-01-25 ENCOUNTER — Ambulatory Visit (HOSPITAL_COMMUNITY): Payer: Self-pay | Admitting: Anesthesiology

## 2021-01-25 ENCOUNTER — Ambulatory Visit (HOSPITAL_COMMUNITY)
Admission: RE | Admit: 2021-01-25 | Discharge: 2021-01-25 | Disposition: A | Discharge status: Home / Self Care | Payer: Self-pay | Attending: Urology | Admitting: Urology

## 2021-01-25 DIAGNOSIS — Z791 Long term (current) use of non-steroidal anti-inflammatories (NSAID): Secondary | ICD-10-CM | POA: Insufficient documentation

## 2021-01-25 DIAGNOSIS — N3289 Other specified disorders of bladder: Secondary | ICD-10-CM | POA: Insufficient documentation

## 2021-01-25 DIAGNOSIS — Z880 Allergy status to penicillin: Secondary | ICD-10-CM | POA: Insufficient documentation

## 2021-01-25 DIAGNOSIS — Z881 Allergy status to other antibiotic agents status: Secondary | ICD-10-CM | POA: Insufficient documentation

## 2021-01-25 DIAGNOSIS — Z87891 Personal history of nicotine dependence: Secondary | ICD-10-CM | POA: Insufficient documentation

## 2021-01-25 HISTORY — PX: CYSTOSCOPY WITH FULGERATION: SHX6638

## 2021-01-25 HISTORY — PX: CYSTOSCOPY WITH BIOPSY: SHX5122

## 2021-01-25 SURGERY — CYSTOSCOPY, WITH BIOPSY
Anesthesia: General | Site: Bladder

## 2021-01-25 MED ORDER — DEXAMETHASONE SODIUM PHOSPHATE 10 MG/ML IJ SOLN
INTRAMUSCULAR | Status: DC | PRN
  Administered 2021-01-25: 10 mg via INTRAVENOUS

## 2021-01-25 MED ORDER — PROPOFOL 10 MG/ML IV BOLUS
INTRAVENOUS | Status: AC
  Filled 2021-01-25: qty 40

## 2021-01-25 MED ORDER — FENTANYL CITRATE (PF) 100 MCG/2ML IJ SOLN
INTRAMUSCULAR | Status: AC
  Filled 2021-01-25: qty 2

## 2021-01-25 MED ORDER — DEXMEDETOMIDINE (PRECEDEX) IN NS 20 MCG/5ML (4 MCG/ML) IV SYRINGE
PREFILLED_SYRINGE | INTRAVENOUS | Status: DC | PRN
  Administered 2021-01-25: 20 ug via INTRAVENOUS

## 2021-01-25 MED ORDER — ORAL CARE MOUTH RINSE: 15.0000 mL | Freq: Once | OROMUCOSAL | Status: AC

## 2021-01-25 MED ORDER — LACTATED RINGERS IV SOLN: INTRAVENOUS | Status: DC

## 2021-01-25 MED ORDER — KETOROLAC TROMETHAMINE 30 MG/ML IJ SOLN
INTRAMUSCULAR | Status: AC
  Filled 2021-01-25: qty 1

## 2021-01-25 MED ORDER — MIDAZOLAM HCL 5 MG/5ML IJ SOLN
INTRAMUSCULAR | Status: DC | PRN
  Administered 2021-01-25 (×2): 2 mg via INTRAVENOUS

## 2021-01-25 MED ORDER — STERILE WATER FOR IRRIGATION IR SOLN
Status: DC | PRN
  Administered 2021-01-25: 3000 mL

## 2021-01-25 MED ORDER — WATER FOR IRRIGATION, STERILE IR SOLN
Status: DC | PRN
  Administered 2021-01-25: 1000 mL

## 2021-01-25 MED ORDER — MIDAZOLAM HCL 2 MG/2ML IJ SOLN
INTRAMUSCULAR | Status: AC
  Filled 2021-01-25: qty 2

## 2021-01-25 MED ORDER — CEFAZOLIN SODIUM-DEXTROSE 2-4 GM/100ML-% IV SOLN
INTRAVENOUS | Status: AC
  Filled 2021-01-25: qty 100

## 2021-01-25 MED ORDER — PROPOFOL 10 MG/ML IV BOLUS
INTRAVENOUS | Status: DC | PRN
  Administered 2021-01-25: 100 mg via INTRAVENOUS
  Administered 2021-01-25: 300 mg via INTRAVENOUS

## 2021-01-25 MED ORDER — KETOROLAC TROMETHAMINE 30 MG/ML IJ SOLN
INTRAMUSCULAR | Status: DC | PRN
  Administered 2021-01-25: 30 mg via INTRAVENOUS

## 2021-01-25 MED ORDER — ONDANSETRON HCL 4 MG/2ML IJ SOLN
INTRAMUSCULAR | Status: DC | PRN
  Administered 2021-01-25: 4 mg via INTRAVENOUS

## 2021-01-25 MED ORDER — FENTANYL CITRATE (PF) 100 MCG/2ML IJ SOLN
25.0000 ug | INTRAMUSCULAR | Status: DC | PRN
  Administered 2021-01-25 (×2): 50 ug via INTRAVENOUS

## 2021-01-25 MED ORDER — LIDOCAINE HCL (CARDIAC) PF 100 MG/5ML IV SOSY
PREFILLED_SYRINGE | INTRAVENOUS | Status: DC | PRN
  Administered 2021-01-25: 60 mg via INTRAVENOUS

## 2021-01-25 MED ORDER — HYDROCODONE-ACETAMINOPHEN 5-325 MG PO TABS: 1.0000 | ORAL_TABLET | Freq: Four times a day (QID) | ORAL | 0 refills | Status: DC | PRN

## 2021-01-25 MED ORDER — CEFAZOLIN SODIUM-DEXTROSE 2-4 GM/100ML-% IV SOLN
2.0000 g | INTRAVENOUS | Status: AC
  Administered 2021-01-25: 2 g via INTRAVENOUS
  Filled 2021-01-25: qty 100

## 2021-01-25 MED ORDER — CHLORHEXIDINE GLUCONATE 0.12 % MT SOLN
15.0000 mL | Freq: Once | OROMUCOSAL | Status: AC
  Administered 2021-01-25: 15 mL via OROMUCOSAL
  Filled 2021-01-25: qty 15

## 2021-01-25 MED ORDER — FENTANYL CITRATE (PF) 100 MCG/2ML IJ SOLN
INTRAMUSCULAR | Status: DC | PRN
  Administered 2021-01-25: 100 ug via INTRAVENOUS

## 2021-01-25 MED ORDER — ONDANSETRON HCL 4 MG/2ML IJ SOLN: 4.0000 mg | Freq: Once | INTRAMUSCULAR | Status: DC | PRN

## 2021-01-25 MED ORDER — DEXAMETHASONE SODIUM PHOSPHATE 10 MG/ML IJ SOLN
INTRAMUSCULAR | Status: AC
  Filled 2021-01-25: qty 1

## 2021-01-25 MED ORDER — DEXMEDETOMIDINE (PRECEDEX) IN NS 20 MCG/5ML (4 MCG/ML) IV SYRINGE
PREFILLED_SYRINGE | INTRAVENOUS | Status: AC
  Filled 2021-01-25: qty 5

## 2021-01-25 MED ORDER — LIDOCAINE HCL (PF) 2 % IJ SOLN
INTRAMUSCULAR | Status: AC
  Filled 2021-01-25: qty 5

## 2021-01-25 MED ORDER — ONDANSETRON HCL 4 MG/2ML IJ SOLN
INTRAMUSCULAR | Status: AC
  Filled 2021-01-25: qty 2

## 2021-01-25 SURGICAL SUPPLY — 19 items
BAG DRAIN URO TABLE W/ADPT NS (BAG) ×2 IMPLANT
BAG DRN 8 ADPR NS SKTRN CSTL (BAG) ×1
BAG HAMPER (MISCELLANEOUS) ×2 IMPLANT
CLOTH BEACON ORANGE TIMEOUT ST (SAFETY) ×2 IMPLANT
ELECT REM PT RETURN 9FT ADLT (ELECTROSURGICAL) ×2
ELECTRODE REM PT RTRN 9FT ADLT (ELECTROSURGICAL) ×1 IMPLANT
GLOVE BIO SURGEON STRL SZ8 (GLOVE) ×2 IMPLANT
GLOVE SURG UNDER POLY LF SZ7 (GLOVE) ×4 IMPLANT
GOWN STRL REUS W/TWL LRG LVL3 (GOWN DISPOSABLE) ×2 IMPLANT
GOWN STRL REUS W/TWL XL LVL3 (GOWN DISPOSABLE) ×2 IMPLANT
KIT TURNOVER CYSTO (KITS) ×2 IMPLANT
MANIFOLD NEPTUNE II (INSTRUMENTS) ×2 IMPLANT
NEEDLE HYPO 18GX1.5 BLUNT FILL (NEEDLE) ×2 IMPLANT
PACK CYSTO (CUSTOM PROCEDURE TRAY) ×2 IMPLANT
PAD ARMBOARD 7.5X6 YLW CONV (MISCELLANEOUS) ×2 IMPLANT
PAD TELFA 3X4 1S STER (GAUZE/BANDAGES/DRESSINGS) ×2 IMPLANT
TOWEL OR 17X26 4PK STRL BLUE (TOWEL DISPOSABLE) ×2 IMPLANT
WATER STERILE IRR 3000ML UROMA (IV SOLUTION) ×2 IMPLANT
WATER STERILE IRR 500ML POUR (IV SOLUTION) ×2 IMPLANT

## 2021-01-25 NOTE — H&P (Signed)
Bladder mass  HPI: Mr Christopher Castro is a 34yo here for evaluation of urinary frequency and a bladder mass. He has been having worsening urinary frequency with associated urinary urgency for 5 months. He has suprapubic pain that has been present for 4 months. He underwent CT in Dec 2021 for his abdominal pain and was found to have a 1cm cystic mass at the dome of the bladder with a 34mm calcification. No other associated symptoms.    PMH:     Past Medical History:  Diagnosis Date  . Anxiety   . HTN (hypertension)   . Substance abuse (HCC)    remote past    Surgical History:      Past Surgical History:  Procedure Laterality Date  . ESOPHAGOGASTRODUODENOSCOPY (EGD) WITH PROPOFOL N/A 10/05/2020   normal esophagus, small hiatal hernia, otherwise normal  . WISDOM TOOTH EXTRACTION      Home Medications:       Allergies as of 12/12/2020      Reactions   Augmentin [amoxicillin-pot Clavulanate] Nausea And Vomiting         Medication List       Accurate as of December 12, 2020  5:07 PM. If you have any questions, ask your nurse or doctor.        STOP taking these medications   fluticasone 50 MCG/ACT nasal spray Commonly known as: FLONASE Stopped by: Wilkie Aye, MD   ibuprofen 200 MG tablet Commonly known as: ADVIL Stopped by: Wilkie Aye, MD   pseudoephedrine 120 MG 12 hr tablet Commonly known as: SUDAFED Stopped by: Wilkie Aye, MD     TAKE these medications   buprenorphine 8 MG Subl SL tablet Commonly known as: SUBUTEX Place 8 mg under the tongue daily.   esomeprazole 40 MG capsule Commonly known as: NexIUM Take 1 capsule (40 mg total) by mouth daily before breakfast.   metoprolol tartrate 50 MG tablet Commonly known as: LOPRESSOR Take 1 tablet by mouth twice daily       Allergies:      Allergies  Allergen Reactions  . Augmentin [Amoxicillin-Pot Clavulanate] Nausea And Vomiting    Family History:      Family  History  Problem Relation Age of Onset  . Asthma Brother   . Asthma Brother   . Colon cancer Neg Hx   . Colon polyps Neg Hx     Social History:  reports that he quit smoking about 5 months ago. His smoking use included cigarettes. He has a 13.50 pack-year smoking history. He has never used smokeless tobacco. He reports previous alcohol use. He reports previous drug use. Drug: Opium.  ROS: All other review of systems were reviewed and are negative except what is noted above in HPI  Physical Exam: BP (!) 153/79   Pulse 72   Temp 98.8 F (37.1 C)   Constitutional:  Alert and oriented, No acute distress. HEENT: [place] AT, moist mucus membranes.  Trachea midline, no masses. Cardiovascular: No clubbing, cyanosis, or edema. Respiratory: Normal respiratory effort, no increased work of breathing. GI: Abdomen is soft, nontender, nondistended, no abdominal masses GU: No CVA tenderness.  Lymph: No cervical or inguinal lymphadenopathy. Skin: No rashes, bruises or suspicious lesions. Neurologic: Grossly intact, no focal deficits, moving all 4 extremities. Psychiatric: Normal mood and affect.  Laboratory Data: Recent Labs       Lab Results  Component Value Date   WBC 6.2 05/31/2020   HGB 14.1 05/31/2020   HCT 42.4 05/31/2020   MCV  88.9 05/31/2020   PLT 296 05/31/2020      Recent Labs       Lab Results  Component Value Date   CREATININE 0.70 11/21/2020      Recent Labs  No results found for: PSA    Recent Labs  No results found for: TESTOSTERONE    Recent Labs       Lab Results  Component Value Date   HGBA1C 5.5 05/31/2020      Urinalysis Labs (Brief)          Component Value Date/Time   APPEARANCEUR Clear 12/12/2020 1413   GLUCOSEU Negative 12/12/2020 1413   BILIRUBINUR Negative 12/12/2020 1413   PROTEINUR Trace (A) 12/12/2020 1413   NITRITE Negative 12/12/2020 1413   LEUKOCYTESUR Negative 12/12/2020 1413      Recent Labs        Lab Results  Component Value Date   LABMICR See below: 12/12/2020   WBCUA None seen 12/12/2020   LABEPIT None seen 12/12/2020   BACTERIA None seen 12/12/2020      Pertinent Imaging: CT 11/21/2020: Images reviewed and discussed with the patient No results found for this or any previous visit.  No results found for this or any previous visit.  No results found for this or any previous visit.  No results found for this or any previous visit.  No results found for this or any previous visit.  No results found for this or any previous visit.  No results found for this or any previous visit.  No results found for this or any previous visit.   Assessment & Plan:    1. Bladder mass We discussed the management including cystoscopy, bladder biopsy and after discussing the procedure the patient wishes to proceed with surgery. Risks/benefits/alternatives discussed.    No follow-ups on file.  Wilkie Aye, MD

## 2021-01-25 NOTE — Anesthesia Preprocedure Evaluation (Signed)
Anesthesia Evaluation  Patient identified by MRN, date of birth, ID band Patient awake    Reviewed: Allergy & Precautions, H&P , NPO status , [name] Chart, lab work & pertinent test results, reviewed documented beta blocker date and time   Airway Mallampati: II  TM Distance: >3 FB Neck ROM: full    Dental no notable dental hx.    Pulmonary neg pulmonary ROS, former smoker,    Pulmonary exam normal breath sounds clear to auscultation       Cardiovascular Exercise Tolerance: Good hypertension, negative cardio ROS   Rhythm:regular Rate:Normal     Neuro/Psych PSYCHIATRIC DISORDERS Anxiety negative neurological ROS     GI/Hepatic negative GI ROS, (+)     substance abuse (Remote hx)  ,   Endo/Other  negative endocrine ROS  Renal/GU negative Renal ROS  negative genitourinary   Musculoskeletal   Abdominal   Peds  Hematology negative hematology ROS (+)   Anesthesia Other Findings   Reproductive/Obstetrics negative OB ROS                             Anesthesia Physical Anesthesia Plan  ASA: II  Anesthesia Plan: General   Post-op Pain Management:    Induction:   PONV Risk Score and Plan: Propofol infusion  Airway Management Planned:   Additional Equipment:   Intra-op Plan:   Post-operative Plan:   Informed Consent: I have reviewed the patients History and Physical, chart, labs and discussed the procedure including the risks, benefits and alternatives for the proposed anesthesia with the patient or authorized representative who has indicated his/her understanding and acceptance.     Dental Advisory Given  Plan Discussed with: CRNA  Anesthesia Plan Comments:         Anesthesia Quick Evaluation

## 2021-01-25 NOTE — Discharge Instructions (Signed)
Bladder Biopsy, Care After This sheet gives you information about how to care for yourself after your procedure. Your health care provider may also give you more specific instructions. If you have problems or questions, contact your health care provider. What can I expect after the procedure? After the procedure, it is common to have:  Mild pain in your bladder or kidney area during urination.  Minor burning during urination.  Small amounts of blood in your urine.  A sudden urge to urinate.  A need to urinate more often than usual. Follow these instructions at home: Medicines  Take over-the-counter and prescription medicines only as told by your health care provider.  If you were prescribed an antibiotic medicine, take it as told by your health care provider. Do not stop taking the antibiotic even if you start to feel better. Activity  Rest if told by your health care provider.  Do not drive for 24 hours if you received a medicine to help you relax (sedative) during your procedure. Ask your health care provider when it is safe for you to drive.  Return to your normal activities as told by your health care provider. Ask your health care provider what activities are safe for you. General instructions  Take a warm bath to relieve any burning sensations around your urethra.  Hold a warm, damp washcloth over the urethral area to ease pain.  It is up to you to get the results of your procedure. Ask your health care provider, or the department that is doing the procedure, when your results will be ready.  Keep all follow-up visits as told by your health care provider. This is important.   Contact a health care provider if:  You have a fever.  Your symptoms do not improve within 24 hours, and you continue to have: ? Burning during urination. ? Increasing amounts of blood in your urine. ? Pain during urination. ? An urgent need to urinate. ? A need to urinate more often than  usual. Get help right away if:  You have a lot of bleeding or more bleeding.  You have severe pain.  You are unable to urinate.  You have bright red blood in your urine.  You are passing blood clots in your urine.  You have a fever. Summary  After the procedure, it is common to have mild pain, burning with urination, and some blood.  Take medicines as told. If you were given antibiotics, finish all of it even if you start to feel better.  Rest after the procedure. Follow your health care provider's instructions for self care at home.  Contact a health care provider if your symptoms do not improve within 24 hours, or if you have more pain or more blood in your urine.  Get help right away if you have a lot of bleeding, severe pain, fever, or bright red blood or blood clots in the urine. This information is not intended to replace advice given to you by your health care provider. Make sure you discuss any questions you have with your health care provider. Document Revised: 05/26/2019 Document Reviewed: 05/26/2019 Elsevier Patient Education  2021 Elsevier Inc.    Acetaminophen; Hydrocodone tablets or capsules What is this medicine? ACETAMINOPHEN; HYDROCODONE (a set a MEE noe fen; hye droe KOE done) is a pain reliever. It is used to treat moderate to severe pain. This medicine may be used for other purposes; ask your health care provider or pharmacist if you have questions. COMMON  BRAND NAME(S): Anexsia, Bancap HC, Ceta-Plus, Co-Gesic, Comfortpak, Dolagesic, [place], [address], [address], Hydrogesic, [place], Lorcet HD, Lorcet Plus, [place], Margesic H, [place], [place], Polygesic, [place], [place], Retail [employer], Vicodin, Vicodin ES, Vicodin HP, [place] What should I tell my health care provider before I take this medicine? They need to know if you have any of these conditions:  brain tumor  drug abuse or addiction  head injury  heart disease  if you often drink  alcohol  kidney disease  liver disease  low adrenal gland function  lung disease, asthma, or breathing problems  seizures  stomach or intestine problems  taken an MAOI like Marplan, Nardil, or Parnate in the last 14 days  an unusual or allergic reaction to acetaminophen, hydrocodone, other medicines, foods, dyes, or preservatives  pregnant or trying to get pregnant  breast-feeding How should I use this medicine? Take this medicine by mouth with a glass of water. Follow the directions on the prescription label. You can take it with or without food. If it upsets your stomach, take it with food. Do not take your medicine more often than directed. A special MedGuide will be given to you by the pharmacist with each prescription and refill. Be sure to read this information carefully each time. Talk to your pediatrician regarding the use of this medicine in children. Special care may be needed. Overdosage: If you think you have taken too much of this medicine contact a poison control center or emergency room at once. NOTE: This medicine is only for you. Do not share this medicine with others. What if I miss a dose? If you miss a dose, take it as soon as you can. If it is almost time for your next dose, take only that dose. Do not take double or extra doses. What may interact with this medicine? This medicine may interact with the following medications:  alcohol  antiviral medicines for HIV or AIDS  atropine  antihistamines for allergy, cough and cold  certain antibiotics like erythromycin, clarithromycin  certain medicines for anxiety or sleep  certain medicines for bladder problems like oxybutynin, tolterodine  certain medicines for depression like amitriptyline, fluoxetine, sertraline  certain medicines for fungal infections like ketoconazole and itraconazole  certain medicines for Parkinson's disease like benztropine, trihexyphenidyl  certain medicines for seizures  like carbamazepine, phenobarbital, phenytoin, primidone  certain medicines for stomach problems like dicyclomine, hyoscyamine  certain medicines for travel sickness like scopolamine  general anesthetics like halothane, isoflurane, methoxyflurane, propofol  ipratropium  local anesthetics like lidocaine, pramoxine, tetracaine  MAOIs like Carbex, Eldepryl, Marplan, Nardil, and Parnate  medicines that relax muscles for surgery  other medicines with acetaminophen  other narcotic medicines for pain or cough  phenothiazines like chlorpromazine, mesoridazine, prochlorperazine, thioridazine  rifampin This list may not describe all possible interactions. Give your health care provider a list of all the medicines, herbs, non-prescription drugs, or dietary supplements you use. Also tell them if you smoke, drink alcohol, or use illegal drugs. Some items may interact with your medicine. What should I watch for while using this medicine? Tell your health care provider if your pain does not go away, if it gets worse, or if you have new or a different type of pain. You may develop tolerance to this drug. Tolerance means that you will need a higher dose of the drug for pain relief. Tolerance is normal and is expected if you take this drug for a long time. There are different types of narcotic drugs (opioids)  for pain. If you take more than one type at the same time, you may have more side effects. Give your health care provider a list of all drugs you use. He or she will tell you how much drug to take. Do not take more drug than directed. Get emergency help right away if you have problems breathing. Do not suddenly stop taking your drug because you may develop a severe reaction. Your body becomes used to the drug. This does NOT mean you are addicted. Addiction is a behavior related to getting and using a drug for a nonmedical reason. If you have pain, you have a medical reason to take pain drug. Your health  care provider will tell you how much drug to take. If your health care provider wants you to stop the drug, the dose will be slowly lowered over time to avoid any side effects. Talk to your health care provider about naloxone and how to get it. Naloxone is an emergency drug used for an opioid overdose. An overdose can happen if you take too much opioid. It can also happen if an opioid is taken with some other drugs or substances, like alcohol. Know the symptoms of an overdose, like trouble breathing, unusually tired or sleepy, or not being able to respond or wake up. Make sure to tell caregivers and close contacts where it is stored. Make sure they know how to use it. After naloxone is given, you must get emergency help right away. Naloxone is a temporary treatment. Repeat doses may be needed. Do not take other drugs that contain acetaminophen with this drug. Many non-prescription drugs contain acetaminophen. Always read labels carefully. If you have questions, ask your health care provider. If you take too much acetaminophen, get medical help right away. Too much acetaminophen can be very dangerous and cause liver damage. Even if you do not have symptoms, it is important to get help right away. You may get drowsy or dizzy. Do not drive, use machinery, or do anything that needs mental alertness until you know how this drug affects you. Do not stand up or sit up quickly, especially if you are an older patient. This reduces the risk of dizzy or fainting spells. Alcohol may interfere with the effect of this drug. Avoid alcoholic drinks. This drug will cause constipation. If you do not have a bowel movement for 3 days, call your health care provider. Your mouth may get dry. Chewing sugarless gum or sucking hard candy and drinking plenty of water may help. Contact your health care provider if the problem does not go away or is severe. What side effects may I notice from receiving this medicine? Side effects that  you should report to your doctor or health care professional as soon as possible:  allergic reactions like skin rash, itching or hives, swelling of the face, lips, or tongue  breathing problems  confusion  redness, blistering, peeling or loosening of the skin, including inside the mouth  signs and symptoms of low blood pressure like dizziness; feeling faint or lightheaded, falls; unusually weak or tired  trouble passing urine or change in the amount of urine  yellowing of the eyes or skin Side effects that usually do not require medical attention (report to your doctor or health care professional if they continue or are bothersome):  constipation  dry mouth  nausea, vomiting  tiredness This list may not describe all possible side effects. Call your doctor for medical advice about side effects. You may  report side effects to FDA at 1-800-FDA-1088. Where should I keep my medicine? Keep out of the reach of children. This medicine can be abused. Keep your medicine in a safe place to protect it from theft. Do not share this medicine with anyone. Selling or giving away this medicine is dangerous and against the law. Store at room temperature between 15 and 30 degrees C (59 and 86 degrees F). This medicine may cause harm and death if it is taken by other adults, children, or pets. Return medicine that has not been used to an official disposal site. Contact the DEA at (276)020-64161-954 705 7182 or your city/county government to find a site. If you cannot return the medicine, flush it down the toilet. Do not use the medicine after the expiration date. NOTE: This sheet is a summary. It may not cover all possible information. If you have questions about this medicine, talk to your doctor, pharmacist, or health care provider.  2021 Elsevier/Gold Standard (2019-06-30 12:25:54)     General Anesthesia, Adult, Care After This sheet gives you information about how to care for yourself after your procedure.  Your health care provider may also give you more specific instructions. If you have problems or questions, contact your health care provider. What can I expect after the procedure? After the procedure, the following side effects are common:  Pain or discomfort at the IV site.  Nausea.  Vomiting.  Sore throat.  Trouble concentrating.  Feeling cold or chills.  Feeling weak or tired.  Sleepiness and fatigue.  Soreness and body aches. These side effects can affect parts of the body that were not involved in surgery. Follow these instructions at home: For the time period you were told by your health care provider:  Rest.  Do not participate in activities where you could fall or become injured.  Do not drive or use machinery.  Do not drink alcohol.  Do not take sleeping pills or medicines that cause drowsiness.  Do not make important decisions or sign legal documents.  Do not take care of children on your own.   Eating and drinking  Follow any instructions from your health care provider about eating or drinking restrictions.  When you feel hungry, start by eating small amounts of foods that are soft and easy to digest (bland), such as toast. Gradually return to your regular diet.  Drink enough fluid to keep your urine pale yellow.  If you vomit, rehydrate by drinking water, juice, or clear broth. General instructions  If you have sleep apnea, surgery and certain medicines can increase your risk for breathing problems. Follow instructions from your health care provider about wearing your sleep device: ? Anytime you are sleeping, including during daytime naps. ? While taking prescription pain medicines, sleeping medicines, or medicines that make you drowsy.  Have a responsible adult stay with you for the time you are told. It is important to have someone help care for you until you are awake and alert.  Return to your normal activities as told by your health care  provider. Ask your health care provider what activities are safe for you.  Take over-the-counter and prescription medicines only as told by your health care provider.  If you smoke, do not smoke without supervision.  Keep all follow-up visits as told by your health care provider. This is important. Contact a health care provider if:  You have nausea or vomiting that does not get better with medicine.  You cannot eat or drink without  vomiting.  You have pain that does not get better with medicine.  You are unable to pass urine.  You develop a skin rash.  You have a fever.  You have redness around your IV site that gets worse. Get help right away if:  You have difficulty breathing.  You have chest pain.  You have blood in your urine or stool, or you vomit blood. Summary  After the procedure, it is common to have a sore throat or nausea. It is also common to feel tired.  Have a responsible adult stay with you for the time you are told. It is important to have someone help care for you until you are awake and alert.  When you feel hungry, start by eating small amounts of foods that are soft and easy to digest (bland), such as toast. Gradually return to your regular diet.  Drink enough fluid to keep your urine pale yellow.  Return to your normal activities as told by your health care provider. Ask your health care provider what activities are safe for you. This information is not intended to replace advice given to you by your health care provider. Make sure you discuss any questions you have with your health care provider. Document Revised: 08/03/2020 Document Reviewed: 03/02/2020 Elsevier Patient Education  2021 [employer].

## 2021-01-25 NOTE — Anesthesia Postprocedure Evaluation (Signed)
Anesthesia Post Note  Patient: Christopher Castro  Procedure(s) Performed: CYSTOSCOPY WITH BIOPSY (N/A Bladder) CYSTOSCOPY WITH FULGERATION (N/A Bladder)  Patient location during evaluation: PACU Anesthesia Type: General Level of consciousness: awake, patient cooperative and sedated Pain management: pain level controlled Vital Signs Assessment: post-procedure vital signs reviewed and stable Respiratory status: spontaneous breathing and nonlabored ventilation Cardiovascular status: stable Postop Assessment: no apparent nausea or vomiting Anesthetic complications: no   No complications documented.   Last Vitals:  Vitals:   01/25/21 0732  BP: 126/83  Pulse: 72  Resp: 16  Temp: 37 C  SpO2: 98%    Last Pain:  Vitals:   01/25/21 0732  TempSrc: Oral  PainSc: 4                  Biviana Saddler

## 2021-01-25 NOTE — Op Note (Signed)
Preoperative diagnosis: bladder lesion  Postoperative diagnosis: Same  Procedure: 1 cystoscopy 2. Bladder biopsy with fulgeration  Attending: Wilkie Aye  Anesthesia: General  Estimated blood loss: Minimal  Drains: none  Specimens:  Bladder biopsies dome x1  Antibiotics: ancef  Findings: Ureteral orifices in normal anatomic location. 22mm dome lesion  Indications: Patient is a 35 year old male with a history of bladder lesion on office cystoscopy. After discussing treatment options, they decided proceed with bladder biopsy   Procedure in detail: The patient was brought to the operating room and a brief timeout was done to ensure correct patient, correct procedure, correct site. General anesthesia was administered patient was placed in dorsal lithotomy position. Their genitalia was then prepped and draped in usual sterile fashion. A rigid 22 French cystoscope was passed in the urethra and the bladder. Bladder was inspected and we noted a 57mm dome lesion. the ureteral orifices were in the normal orthotopic locations.Using the biopsy forceps the dome lesion was removed and sent for pathology. Hemostasis was then obtained with a bugbee. the bladder was then drained and this concluded the procedure which was well tolerated by patient.  Complications: None  Condition: Stable, extubated, transferred to PACU  Plan: Patient will be discharged home and will followup in 1 week for a pathology discussion

## 2021-01-25 NOTE — Progress Notes (Signed)
01/25/2021 12:12pm call to mother Niam Nepomuceno [phone] , patient red and grey costa hat left at hospital, placed at postoperative desk, labeled, mother verbalized she will pick up Friday 01/26/21 around 9am.

## 2021-01-25 NOTE — Anesthesia Procedure Notes (Signed)
Procedure Name: LMA Insertion Date/Time: 01/25/2021 9:12 AM Performed by: Shanon Payor, CRNA Pre-anesthesia Checklist: Patient identified, Emergency Drugs available, Timeout performed, Patient being monitored and Suction available Patient Re-evaluated:Patient Re-evaluated prior to induction Oxygen Delivery Method: Circle system utilized Preoxygenation: Pre-oxygenation with 100% oxygen Induction Type: IV induction LMA: LMA flexible inserted LMA Size: 5.0 Number of attempts: 1 Placement Confirmation: positive ETCO2 and breath sounds checked- equal and bilateral Tube secured with: Tape Dental Injury: Teeth and Oropharynx as per pre-operative assessment

## 2021-01-25 NOTE — Transfer of Care (Signed)
Immediate Anesthesia Transfer of Care Note  Patient: Christopher Castro  Procedure(s) Performed: CYSTOSCOPY WITH BIOPSY (N/A Bladder) CYSTOSCOPY WITH FULGERATION (N/A Bladder)  Patient Location: PACU  Anesthesia Type:General  Level of Consciousness: awake and drowsy  Airway & Oxygen Therapy: Patient Spontanous Breathing  Post-op Assessment: Report given to RN, Post -op Vital signs reviewed and stable and Patient moving all extremities X 4  Post vital signs: Reviewed and stable  Last Vitals:  Vitals Value Taken Time  BP    Temp    Pulse 73 01/25/21 0950  Resp 16 01/25/21 0950  SpO2 94 % 01/25/21 0950  Vitals shown include unvalidated device data.  Last Pain:  Vitals:   01/25/21 0732  TempSrc: Oral  PainSc: 4       Patients Stated Pain Goal: 8 (01/25/21 0732)  Complications: No complications documented.

## 2021-01-26 ENCOUNTER — Encounter (HOSPITAL_COMMUNITY): Payer: Self-pay | Admitting: Urology

## 2021-01-26 LAB — SURGICAL PATHOLOGY

## 2021-01-30 ENCOUNTER — Other Ambulatory Visit: Payer: Self-pay | Admitting: Physician Assistant

## 2021-01-30 MED ORDER — METOPROLOL TARTRATE 50 MG PO TABS: 50.0000 mg | ORAL_TABLET | Freq: Two times a day (BID) | ORAL | 1 refills | Status: DC

## 2021-02-01 ENCOUNTER — Other Ambulatory Visit: Payer: Self-pay

## 2021-02-01 ENCOUNTER — Ambulatory Visit: Payer: Self-pay | Admitting: Physician Assistant

## 2021-02-01 ENCOUNTER — Encounter: Payer: Self-pay | Admitting: Physician Assistant

## 2021-02-01 VITALS — BP 150/85 | HR 72 | Temp 98.1°F

## 2021-02-01 DIAGNOSIS — E785 Hyperlipidemia, unspecified: Secondary | ICD-10-CM

## 2021-02-01 DIAGNOSIS — I10 Essential (primary) hypertension: Secondary | ICD-10-CM

## 2021-02-01 DIAGNOSIS — N3289 Other specified disorders of bladder: Secondary | ICD-10-CM

## 2021-02-01 DIAGNOSIS — J029 Acute pharyngitis, unspecified: Secondary | ICD-10-CM

## 2021-02-01 MED ORDER — METOPROLOL TARTRATE 100 MG PO TABS: 100.0000 mg | ORAL_TABLET | Freq: Two times a day (BID) | ORAL | 4 refills | Status: DC

## 2021-02-01 NOTE — Progress Notes (Signed)
BP (!) 150/85   Pulse 72   Temp 98.1 F (36.7 C)   SpO2 98%    Subjective:    Patient ID: Christopher Castro, male    DOB: Nov 12, 1986, 34 y.o.   MRN: [id number]  HPI: Christopher Castro is a 35 y.o. male presenting on 02/01/2021 for Hypertension and Hyperlipidemia   HPI   Pt had a negative covid 19 screening questionnaire.    Chief Complaint  Patient presents with  . Hypertension  . Hyperlipidemia    Pt has appt to f/u with ENT tomorrow and says he has sinus surgery on 3/8.  He is also been seen by urologist.    He didn't get labs drawn.  He is continuing to abstain from substances.  He has no new issues today.    Relevant past medical, surgical, family and social history reviewed and updated as indicated. Interim medical history since our last visit reviewed. Allergies and medications reviewed and updated.   Current Outpatient Medications:  .  buprenorphine (SUBUTEX) 8 MG SUBL SL tablet, Place 8 mg under the tongue daily. , Disp: , Rfl:  .  esomeprazole (NEXIUM) 40 MG capsule, Take 1 capsule (40 mg total) by mouth daily before breakfast., Disp: 30 capsule, Rfl: 3 .  fluticasone (FLONASE) 50 MCG/ACT nasal spray, Place 1 spray into both nostrils daily., Disp: , Rfl:  .  metoprolol tartrate (LOPRESSOR) 50 MG tablet, Take 1 tablet (50 mg total) by mouth 2 (two) times daily., Disp: 60 tablet, Rfl: 1 .  HYDROcodone-acetaminophen (NORCO) 5-325 MG tablet, Take 1 tablet by mouth every 6 (six) hours as needed for moderate pain. (Patient not taking: Reported on 02/01/2021), Disp: 15 tablet, Rfl: 0    Review of Systems  Per HPI unless specifically indicated above     Objective:    BP (!) 150/85   Pulse 72   Temp 98.1 F (36.7 C)   SpO2 98%   Wt Readings from Last 3 Encounters:  01/25/21 229 lb 0.9 oz (103.9 kg)  01/23/21 229 lb (103.9 kg)  10/23/20 227 lb (103 kg)    Physical Exam Vitals reviewed.  Constitutional:      General: He is not in acute distress.     Appearance: He is well-developed and well-nourished. He is not toxic-appearing.  HENT:     Head: Normocephalic and atraumatic.  Cardiovascular:     Rate and Rhythm: Normal rate and regular rhythm.  Pulmonary:     Effort: Pulmonary effort is normal.     Breath sounds: Normal breath sounds. No wheezing.  Abdominal:     General: Bowel sounds are normal.     Palpations: Abdomen is soft. There is no hepatosplenomegaly.     Tenderness: There is no abdominal tenderness.  Musculoskeletal:        General: No edema.     Cervical back: Neck supple.     Right lower leg: No edema.     Left lower leg: No edema.  Lymphadenopathy:     Cervical: No cervical adenopathy.  Skin:    General: Skin is warm and dry.  Neurological:     Mental Status: He is alert and oriented to person, place, and time.  Psychiatric:        Mood and Affect: Mood and affect normal.        Behavior: Behavior normal.         Assessment & Plan:    Encounter Diagnoses  Name Primary?  . Hypertension,  unspecified type Yes  . Hyperlipidemia, unspecified hyperlipidemia type   . Sore throat   . Bladder mass       -Increase metoprolol for htn -pt to Get fasting labs drawn.  He will be called with restuls -pt to continue with ENT and urology per their recommendations -pt to follow up here 3 months.  He is to contact office sooner prn

## 2021-02-02 ENCOUNTER — Encounter: Payer: Self-pay | Admitting: Urology

## 2021-02-02 ENCOUNTER — Other Ambulatory Visit: Payer: Self-pay

## 2021-02-02 ENCOUNTER — Ambulatory Visit (INDEPENDENT_AMBULATORY_CARE_PROVIDER_SITE_OTHER): Payer: Self-pay | Admitting: Urology

## 2021-02-02 VITALS — BP 154/75 | HR 88 | Temp 98.8°F | Ht 73.0 in | Wt 229.0 lb

## 2021-02-02 DIAGNOSIS — N3289 Other specified disorders of bladder: Secondary | ICD-10-CM

## 2021-02-02 DIAGNOSIS — R350 Frequency of micturition: Secondary | ICD-10-CM

## 2021-02-02 LAB — URINALYSIS, ROUTINE W REFLEX MICROSCOPIC
Bilirubin, UA: NEGATIVE
Ketones, UA: NEGATIVE
Leukocytes,UA: NEGATIVE
Nitrite, UA: NEGATIVE
Protein,UA: NEGATIVE
RBC, UA: NEGATIVE
Specific Gravity, UA: 1.01 (ref 1.005–1.030)
Urobilinogen, Ur: 0.2 mg/dL (ref 0.2–1.0)
pH, UA: 5.5 (ref 5.0–7.5)

## 2021-02-02 NOTE — Progress Notes (Signed)
02/02/2021 1:31 PM   Christopher Castro October 08, 1986 [id number]  Referring provider: Jacquelin Hawking, PA-C [address] [place],  [place] [address]  Followup bladder biopsy  HPI: [name] is a 34yo here for followup after bladder biopsy. Biopsy was normal bladder tissue. He has a 1cm bladder dome cyst. He has intermittent left upper quadrant pain that is present daily.    PMH: Past Medical History:  Diagnosis Date  . Anxiety   . HTN (hypertension)   . Substance abuse (HCC)    remote past    Surgical History: Past Surgical History:  Procedure Laterality Date  . CYSTOSCOPY WITH BIOPSY N/A 01/25/2021   Procedure: CYSTOSCOPY WITH BIOPSY;  Surgeon: Christopher Gauze, MD;  Location: [facility];  Service: Urology;  Laterality: N/A;  . CYSTOSCOPY WITH FULGERATION N/A 01/25/2021   Procedure: CYSTOSCOPY WITH FULGERATION;  Surgeon: Christopher Gauze, MD;  Location: [facility];  Service: Urology;  Laterality: N/A;  . ESOPHAGOGASTRODUODENOSCOPY (EGD) WITH PROPOFOL N/A 10/05/2020   normal esophagus, small hiatal hernia, otherwise normal  . WISDOM TOOTH EXTRACTION      Home Medications:  Allergies as of 02/02/2021      Reactions   Augmentin [amoxicillin-pot Clavulanate] Nausea And Vomiting      Medication List       Accurate as of February 02, 2021  1:31 PM. If you have any questions, ask your nurse or doctor.        buprenorphine 8 MG Subl SL tablet Commonly known as: SUBUTEX Place 8 mg under the tongue daily.   esomeprazole 40 MG capsule Commonly known as: NexIUM Take 1 capsule (40 mg total) by mouth daily before breakfast.   fluticasone 50 MCG/ACT nasal spray Commonly known as: FLONASE Place 1 spray into both nostrils daily.   HYDROcodone-acetaminophen 5-325 MG tablet Commonly known as: Norco Take 1 tablet by mouth every 6 (six) hours as needed for moderate pain.   metoprolol tartrate 100 MG tablet Commonly known as: LOPRESSOR Take 1 tablet (100 mg total) by mouth 2 (two)  times daily.       Allergies:  Allergies  Allergen Reactions  . Augmentin [Amoxicillin-Pot Clavulanate] Nausea And Vomiting    Family History: Family History  Problem Relation Age of Onset  . Asthma Brother   . Asthma Brother   . Colon cancer Neg Hx   . Colon polyps Neg Hx     Social History:  reports that he quit smoking about 6 months ago. His smoking use included cigarettes. He has a 13.50 pack-year smoking history. He has never used smokeless tobacco. He reports previous alcohol use. He reports previous drug use. Drug: Opium.  ROS: All other review of systems were reviewed and are negative except what is noted above in HPI  Physical Exam: BP (!) 154/75   Pulse 88   Temp 98.8 F (37.1 C)   Ht 6\' 1"  (1.854 m)   Wt 229 lb (103.9 kg)   BMI 30.21 kg/m   Constitutional:  Alert and oriented, No acute distress. HEENT: [place] AT, moist mucus membranes.  Trachea midline, no masses. Cardiovascular: No clubbing, cyanosis, or edema. Respiratory: Normal respiratory effort, no increased work of breathing. GI: Abdomen is soft, nontender, nondistended, no abdominal masses GU: No CVA tenderness.  Lymph: No cervical or inguinal lymphadenopathy. Skin: No rashes, bruises or suspicious lesions. Neurologic: Grossly intact, no focal deficits, moving all 4 extremities. Psychiatric: Normal mood and affect.  Laboratory Data: Lab Results  Component Value Date  WBC 6.2 05/31/2020   HGB 14.1 05/31/2020   HCT 42.4 05/31/2020   MCV 88.9 05/31/2020   PLT 296 05/31/2020    Lab Results  Component Value Date   CREATININE 0.70 11/21/2020    No results found for: PSA  No results found for: TESTOSTERONE  Lab Results  Component Value Date   HGBA1C 5.5 05/31/2020    Urinalysis    Component Value Date/Time   APPEARANCEUR Clear 12/12/2020 1413   GLUCOSEU Negative 12/12/2020 1413   BILIRUBINUR Negative 12/12/2020 1413   PROTEINUR Trace (A) 12/12/2020 1413   NITRITE Negative  12/12/2020 1413   LEUKOCYTESUR Negative 12/12/2020 1413    Lab Results  Component Value Date   LABMICR See below: 12/12/2020   WBCUA None seen 12/12/2020   LABEPIT None seen 12/12/2020   BACTERIA None seen 12/12/2020    Pertinent Imaging:  No results found for this or any previous visit.  No results found for this or any previous visit.  No results found for this or any previous visit.  No results found for this or any previous visit.  No results found for this or any previous visit.  No results found for this or any previous visit.  No results found for this or any previous visit.  No results found for this or any previous visit.   Assessment & Plan:    1. Bladder mass -RTC 6 monthd with CT abd/pelvis - Urinalysis, Routine w reflex microscopic  2. Urinary frequency -resolved   No follow-ups on file.  Christopher Aye, MD  [facility] [place]

## 2021-02-02 NOTE — Progress Notes (Signed)
Urological Symptom Review  Patient is experiencing the following symptoms: Burning/pain with urination Get up at night to urinate Stream starts and stops Blood in urine   Review of Systems  Gastrointestinal (upper)  : Negative for upper GI symptoms  Gastrointestinal (lower) : Negative for lower GI symptoms  Constitutional : Negative for symptoms  Skin: Negative for skin symptoms  Eyes: Negative for eye symptoms  Ear/Nose/Throat : Sinus problems  Hematologic/Lymphatic: Negative for Hematologic/Lymphatic symptoms  Cardiovascular : Negative for cardiovascular symptoms  Respiratory : Negative for respiratory symptoms  Endocrine: Negative for endocrine symptoms  Musculoskeletal: Negative for musculoskeletal symptoms  Neurological: Negative for neurological symptoms  Psychologic: Anxiety

## 2021-02-09 NOTE — Progress Notes (Signed)
02/09/2021  14:27, call to patient cell regarding costa red and grey ball cap left at the hospital. "I will get my mom to come get it".  Advised patient at Short Stay nurses station.  If not able to get to hospital can call [phone] when coming to pick up. Patient verbalized understanding.

## 2021-02-12 NOTE — H&P (Signed)
Urology Admission H&P  Chief Complaint: bladder mass  History of Present Illness: Mr Dacanay is a 34yo here for bladder biopsy for a bladder mass found on CT and office cystoscopy. No LUTS.   Past Medical History:  Diagnosis Date  . Anxiety   . HTN (hypertension)   . Substance abuse (HCC)    remote past   Past Surgical History:  Procedure Laterality Date  . CYSTOSCOPY WITH BIOPSY N/A 01/25/2021   Procedure: CYSTOSCOPY WITH BIOPSY;  Surgeon: Malen Gauze, MD;  Location: [facility];  Service: Urology;  Laterality: N/A;  . CYSTOSCOPY WITH FULGERATION N/A 01/25/2021   Procedure: CYSTOSCOPY WITH FULGERATION;  Surgeon: Malen Gauze, MD;  Location: [facility];  Service: Urology;  Laterality: N/A;  . ESOPHAGOGASTRODUODENOSCOPY (EGD) WITH PROPOFOL N/A 10/05/2020   normal esophagus, small hiatal hernia, otherwise normal  . WISDOM TOOTH EXTRACTION      Home Medications:  No current facility-administered medications for this encounter.   Current Outpatient Medications  Medication Sig Dispense Refill Last Dose  . buprenorphine (SUBUTEX) 8 MG SUBL SL tablet Place 8 mg under the tongue daily.    01/25/2021 at 0600  . esomeprazole (NEXIUM) 40 MG capsule Take 1 capsule (40 mg total) by mouth daily before breakfast. 30 capsule 3 01/25/2021 at 0600  . fluticasone (FLONASE) 50 MCG/ACT nasal spray Place 1 spray into both nostrils daily.   01/24/2021 at Unknown time  . HYDROcodone-acetaminophen (NORCO) 5-325 MG tablet Take 1 tablet by mouth every 6 (six) hours as needed for moderate pain. 15 tablet 0   . metoprolol tartrate (LOPRESSOR) 100 MG tablet Take 1 tablet (100 mg total) by mouth 2 (two) times daily. 60 tablet 4    Allergies:  Allergies  Allergen Reactions  . Augmentin [Amoxicillin-Pot Clavulanate] Nausea And Vomiting    Family History  Problem Relation Age of Onset  . Asthma Brother   . Asthma Brother   . Colon cancer Neg Hx   . Colon polyps Neg Hx    Social History:  reports that he  quit smoking about 7 months ago. His smoking use included cigarettes. He has a 13.50 pack-year smoking history. He has never used smokeless tobacco. He reports previous alcohol use. He reports previous drug use. Drug: Opium.  Review of Systems  All other systems reviewed and are negative.   Physical Exam:  Vital signs in last 24 hours:   Physical Exam Vitals reviewed.  Constitutional:      Appearance: Normal appearance.  HENT:     Head: Normocephalic and atraumatic.     Nose: Nose normal.  Eyes:     Extraocular Movements: Extraocular movements intact.     Pupils: Pupils are equal, round, and reactive to light.  Cardiovascular:     Rate and Rhythm: Normal rate and regular rhythm.  Pulmonary:     Effort: Pulmonary effort is normal. No respiratory distress.  Abdominal:     General: Abdomen is flat. There is no distension.  Musculoskeletal:        General: No swelling. Normal range of motion.     Cervical back: Normal range of motion and neck supple.  Skin:    General: Skin is warm and dry.  Neurological:     General: No focal deficit present.     Mental Status: He is alert and oriented to person, place, and time.  Psychiatric:        Mood and Affect: Mood normal.        Behavior:  Behavior normal.        Thought Content: Thought content normal.        Judgment: Judgment normal.     Laboratory Data:  No results found for this or any previous visit (from the past 24 hour(s)). No results found for this or any previous visit (from the past 240 hour(s)). Creatinine: No results for input(s): CREATININE in the last 168 hours. Baseline Creatinine:   Impression/Assessment:  34yo with a bladder mass  Plan:  1.Bladder mass We discussed the management including cystoscopy, bladder biopsy and after discussing the procedure the patient wishes to proceed with surgery. Risks/benefits/alternatives discussed.  Wilkie Aye 02/12/2021, 8:21 AM

## 2021-02-27 ENCOUNTER — Other Ambulatory Visit: Payer: Self-pay | Admitting: Gastroenterology

## 2021-03-21 ENCOUNTER — Ambulatory Visit (INDEPENDENT_AMBULATORY_CARE_PROVIDER_SITE_OTHER): Payer: Self-pay | Admitting: Gastroenterology

## 2021-03-21 ENCOUNTER — Encounter: Payer: Self-pay | Admitting: Gastroenterology

## 2021-03-21 ENCOUNTER — Other Ambulatory Visit: Payer: Self-pay

## 2021-03-21 DIAGNOSIS — R109 Unspecified abdominal pain: Secondary | ICD-10-CM | POA: Insufficient documentation

## 2021-03-21 MED ORDER — AMITRIPTYLINE HCL 10 MG PO TABS: 10.0000 mg | ORAL_TABLET | Freq: Every day | ORAL | 1 refills | Status: DC

## 2021-03-21 NOTE — Progress Notes (Signed)
Referring Provider: Jacquelin Hawking, PA-C Primary Care Physician:  Jacquelin Hawking, PA-C Primary GI: Dr. Jena Gauss   Chief Complaint  Patient presents with  . Abdominal Pain    Luq, has gotten worse in past month    HPI:   Christopher Castro is a 35 y.o. male presenting today with a history of chronic LUQ pain, s/p EGD with normal esophagus, small hiatal hernia, otherwise normal. 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 He was referred to Urology. Underwent biopsy with normal tissue. Surveillance CT upcoming in Aug 2022. Incidentally also noted mild hepatic steatosis. Appears to have musculoskeletal component.  No constipation or diarrhea. Nexium before breakfast. In the past prior to Nexium, had a very empty feeling in stomach almost raw when waking. Now this is better.   Pain all the time in LUQ that seems to be constant, very localized area a few centimeters in diameter to left of rectus muscle and able to pinpoint area. Hurting throughout the day every day. Bending worsens pain. If already hurting and taking in a deep breath, can feel it. Turning the wrong way will hurt. Not worsened with eating. No overt GI bleeding. No weight loss. No N/V. No changes in bowel habits and no association with bowel habits.     Past Medical History:  Diagnosis Date  . Anxiety   . HTN (hypertension)   . Substance abuse (HCC)    remote past    Past Surgical History:  Procedure Laterality Date  . CYSTOSCOPY WITH BIOPSY N/A 01/25/2021   Procedure: CYSTOSCOPY WITH BIOPSY;  Surgeon: Malen Gauze, MD;  Location: [facility];  Service: Urology;  Laterality: N/A;  . CYSTOSCOPY WITH FULGERATION N/A 01/25/2021   Procedure: CYSTOSCOPY WITH FULGERATION;  Surgeon: Malen Gauze, MD;  Location: [facility];  Service: Urology;  Laterality:  N/A;  . ESOPHAGOGASTRODUODENOSCOPY (EGD) WITH PROPOFOL N/A 10/05/2020   normal esophagus, small hiatal hernia, otherwise normal  . WISDOM TOOTH EXTRACTION      Current Outpatient Medications  Medication Sig Dispense Refill  . buprenorphine (SUBUTEX) 8 MG SUBL SL tablet Place 8 mg under the tongue daily.     Marland Kitchen esomeprazole (NEXIUM) 40 MG capsule TAKE 1 CAPSULE BY MOUTH ONCE DAILY BEFORE BREAKFAST 30 capsule 5  . ibuprofen (ADVIL) 200 MG tablet Take 400 mg by mouth every 6 (six) hours as needed.    . metoprolol tartrate (LOPRESSOR) 100 MG tablet Take 1 tablet (100 mg total) by mouth 2 (two) times daily. 60 tablet 4  . fluticasone (FLONASE) 50 MCG/ACT nasal spray Place 1 spray into both nostrils daily. (Patient not taking: Reported on 03/21/2021)    . HYDROcodone-acetaminophen (NORCO) 5-325 MG tablet Take 1 tablet by mouth every 6 (six) hours as needed for moderate pain. (Patient not taking: Reported on 03/21/2021) 15 tablet 0   No current facility-administered medications for this visit.    Allergies as of 03/21/2021 - Review Complete 03/21/2021  Allergen Reaction Noted  . Augmentin [amoxicillin-pot clavulanate] Nausea And Vomiting 06/13/2020    Family History  Problem Relation Age of Onset  . Asthma Brother   . Asthma Brother   . Colon cancer Neg Hx   . Colon polyps Neg Hx     Social History   Socioeconomic History  . Marital status: Single    Spouse name: Not on file  . Number of children:  Not on file  . Years of education: Not on file  . Highest education level: Not on file  Occupational History  . Occupation: Lawyer  Tobacco Use  . Smoking status: Former Smoker    Packs/day: 0.75    Years: 18.00    Pack years: 13.50    Types: Cigarettes    Quit date: 07/11/2020    Years since quitting: 0.6  . Smokeless tobacco: Never Used  Vaping Use  . Vaping Use: Some days  Substance and Sexual Activity  . Alcohol use: Not Currently  . Drug use: Not Currently     Types: Opium    Comment: hx of opoid use (percocet/hydrocodone) last used 2017  . Sexual activity: Not on file  Other Topics Concern  . Not on file  Social History Narrative  . Not on file   Social Determinants of Health   Financial Resource Strain: Not on file  Food Insecurity: Not on file  Transportation Needs: Not on file  Physical Activity: Not on file  Stress: Not on file  Social Connections: Not on file    Review of Systems: Gen: Denies fever, chills, anorexia. Denies fatigue, weakness, weight loss.  CV: Denies chest pain, palpitations, syncope, peripheral edema, and claudication. Resp: Denies dyspnea at rest, cough, wheezing, coughing up blood, and pleurisy. GI: see HPI Derm: Denies rash, itching, dry skin Psych: Denies depression, anxiety, memory loss, confusion. No homicidal or suicidal ideation.  Heme: Denies bruising, bleeding, and enlarged lymph nodes.  Physical Exam: BP 123/78   Pulse (!) 59   Temp (!) 97.1 F (36.2 C) (Temporal)   Ht 6\' 1"  (1.854 m)   Wt 232 lb 9.6 oz (105.5 kg)   BMI 30.69 kg/m  General:   Alert and oriented. No distress noted. Pleasant and cooperative.  Head:  Normocephalic and atraumatic. Eyes:  Conjuctiva clear without scleral icterus. Mouth:  Mask in place Abdomen:  +BS, soft, mild pinpoint TTP to left of rectus muscle LUQ and non-distended. No rebound or guarding. +Carnett's sign Msk:  Symmetrical without gross deformities. Normal posture. Extremities:  Without edema. Neurologic:  Alert and  oriented x4 Psych:  Alert and cooperative. Normal mood and affect.  ASSESSMENT/PLAN: Christopher Castro is a 35 y.o. male presenting today with a history of chronic localized LUQ pain, s/p EGD with normal esophagus, small hiatal hernia, otherwise normal. 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 He was referred to Urology. Underwent biopsy with normal tissue. Surveillance CT upcoming in Aug 2022. Incidentally also noted mild hepatic steatosis. +Carnett's sign on exam.  He has no alarm signs or red flags. We discussed low dose trial of amitriptyline 10 mg each evening. He is to message in several weeks and can titrate this up as needed. May ultimately need referral to PT or Pain Management. CT on file is reassuring. No lower GI signs/symptoms that would prompt colonoscopy, no association with bowel habits, food intake, etc.   Return in 4-6 months and progress report requested in 2-4 weeks.   Sep 2022, PhD, ANP-BC [facility]

## 2021-03-21 NOTE — Patient Instructions (Addendum)
I have sent in amitriptyline 10 milligrams to take each evening. This is a small dose and we can work up to higher dose if needed. However, message me in 2 weeks with how you are doing. If no improvement, we can increase.  We will see you in 4-6 months regardless! In meantime, we can titrate doses and refer elsewhere (such as PT) if needed.  I enjoyed seeing you again today! As you know, I value our relationship and want to provide genuine, compassionate, and quality care. I welcome your feedback. If you receive a survey regarding your visit,  I greatly appreciate you taking time to fill this out. See you next time!  Gelene Mink, PhD, ANP-BC [facility]

## 2021-03-22 NOTE — Progress Notes (Signed)
CC'ED TO PCP 

## 2021-04-24 ENCOUNTER — Other Ambulatory Visit: Payer: Self-pay | Admitting: Gastroenterology

## 2021-04-24 MED ORDER — AMITRIPTYLINE HCL 10 MG PO TABS: 20.0000 mg | ORAL_TABLET | Freq: Every day | ORAL | 5 refills | Status: DC

## 2021-05-03 ENCOUNTER — Other Ambulatory Visit: Payer: Self-pay | Admitting: Gastroenterology

## 2021-05-03 MED ORDER — AMITRIPTYLINE HCL 10 MG PO TABS: 20.0000 mg | ORAL_TABLET | Freq: Every day | ORAL | 5 refills | Status: DC

## 2021-05-03 NOTE — Progress Notes (Signed)
Amitriptyline 20 mg each evening at bedtime sent to [facility] in [place], previously sent to [facility].

## 2021-05-10 ENCOUNTER — Other Ambulatory Visit (HOSPITAL_COMMUNITY)
Admission: RE | Admit: 2021-05-10 | Discharge: 2021-05-10 | Disposition: A | Discharge status: Home / Self Care | Payer: Self-pay | Source: Ambulatory Visit | Attending: Physician Assistant | Admitting: Physician Assistant

## 2021-05-10 ENCOUNTER — Other Ambulatory Visit: Payer: Self-pay

## 2021-05-10 ENCOUNTER — Encounter: Payer: Self-pay | Admitting: Physician Assistant

## 2021-05-10 ENCOUNTER — Ambulatory Visit: Payer: Self-pay | Admitting: Physician Assistant

## 2021-05-10 VITALS — BP 135/88 | HR 93 | Temp 98.0°F | Wt 223.0 lb

## 2021-05-10 DIAGNOSIS — I10 Essential (primary) hypertension: Secondary | ICD-10-CM | POA: Insufficient documentation

## 2021-05-10 DIAGNOSIS — E785 Hyperlipidemia, unspecified: Secondary | ICD-10-CM

## 2021-05-10 LAB — COMPREHENSIVE METABOLIC PANEL
ALT: 45 U/L — ABNORMAL HIGH (ref 0–44)
AST: 27 U/L (ref 15–41)
Albumin: 4.2 g/dL (ref 3.5–5.0)
Alkaline Phosphatase: 52 U/L (ref 38–126)
Anion gap: 6 (ref 5–15)
BUN: 18 mg/dL (ref 6–20)
CO2: 29 mmol/L (ref 22–32)
Calcium: 9.3 mg/dL (ref 8.9–10.3)
Chloride: 103 mmol/L (ref 98–111)
Creatinine, Ser: 0.86 mg/dL (ref 0.61–1.24)
GFR, Estimated: >60 mL/min (ref >60)
Glucose, Bld: 92 mg/dL (ref 70–99)
Potassium: 3.9 mmol/L (ref 3.5–5.1)
Sodium: 138 mmol/L (ref 135–145)
Total Bilirubin: 1.2 mg/dL (ref 0.3–1.2)
Total Protein: 7.5 g/dL (ref 6.5–8.1)

## 2021-05-10 LAB — LIPID PANEL
Cholesterol: 229 mg/dL — ABNORMAL HIGH (ref 0–200)
HDL: 39 mg/dL — ABNORMAL LOW (ref >40)
LDL Cholesterol: 173 mg/dL — ABNORMAL HIGH (ref 0–99)
Total CHOL/HDL Ratio: 5.9 RATIO
Triglycerides: 84 mg/dL (ref <150)
VLDL: 17 mg/dL (ref 0–40)

## 2021-05-10 NOTE — Progress Notes (Signed)
BP 135/88   Pulse 93   Temp 98 F (36.7 C)   Wt 223 lb (101.2 kg)   SpO2 98%   BMI 29.42 kg/m    Subjective:    Patient ID: Christopher Castro, male    DOB: Apr 13, 1986, 35 y.o.   MRN: [id number]  HPI: Christopher Castro is a 35 y.o. male presenting on 05/10/2021 for Hypertension and Hyperlipidemia   HPI    Pt had a negative covid 19 screening questionnaire.  Chief Complaint  Patient presents with   Hypertension   Hyperlipidemia      He is sseeing GI, urology, ENT and substance abuse counselor.  He says he is doing well and is much improved with how he was doing prior to establishing care with this office.  He did not get his labs drawn as ordered in January.     Relevant past medical, surgical, family and social history reviewed and updated as indicated. Interim medical history since our last visit reviewed. Allergies and medications reviewed and updated.    Current Outpatient Medications:    amitriptyline (ELAVIL) 10 MG tablet, Take 2 tablets (20 mg total) by mouth at bedtime., Disp: 60 tablet, Rfl: 5   buprenorphine (SUBUTEX) 8 MG SUBL SL tablet, Place 8 mg under the tongue daily. , Disp: , Rfl:    esomeprazole (NEXIUM) 40 MG capsule, TAKE 1 CAPSULE BY MOUTH ONCE DAILY BEFORE BREAKFAST, Disp: 30 capsule, Rfl: 5   metoprolol tartrate (LOPRESSOR) 100 MG tablet, Take 1 tablet (100 mg total) by mouth 2 (two) times daily., Disp: 60 tablet, Rfl: 4   ibuprofen (ADVIL) 200 MG tablet, Take 400 mg by mouth every 6 (six) hours as needed. (Patient not taking: Reported on 05/10/2021), Disp: , Rfl:     Review of Systems  Per HPI unless specifically indicated above     Objective:    BP 135/88   Pulse 93   Temp 98 F (36.7 C)   Wt 223 lb (101.2 kg)   SpO2 98%   BMI 29.42 kg/m   Wt Readings from Last 3 Encounters:  05/10/21 223 lb (101.2 kg)  03/21/21 232 lb 9.6 oz (105.5 kg)  02/02/21 229 lb (103.9 kg)    Physical Exam Vitals reviewed.  Constitutional:      General:  He is not in acute distress.    Appearance: He is well-developed. He is not ill-appearing.  HENT:     Head: Normocephalic and atraumatic.  Cardiovascular:     Rate and Rhythm: Normal rate and regular rhythm.  Pulmonary:     Effort: Pulmonary effort is normal.     Breath sounds: Normal breath sounds. No wheezing.  Abdominal:     General: Bowel sounds are normal.     Palpations: Abdomen is soft.     Tenderness: There is no abdominal tenderness.  Musculoskeletal:     Cervical back: Neck supple.     Right lower leg: No edema.     Left lower leg: No edema.  Lymphadenopathy:     Cervical: No cervical adenopathy.  Skin:    General: Skin is warm and dry.  Neurological:     Mental Status: He is alert and oriented to person, place, and time.  Psychiatric:        Behavior: Behavior normal.           Assessment & Plan:    Encounter Diagnoses  Name Primary?   Hypertension, unspecified type Yes   Hyperlipidemia, unspecified hyperlipidemia  type      -pt to get fasting labs drawn; he will be called with results -pt to continue with his specialists per their recommendations -no medication changes today -pt to follow up 4 months.  He is to contact office sooner prn

## 2021-05-23 ENCOUNTER — Other Ambulatory Visit: Payer: Self-pay | Admitting: Physician Assistant

## 2021-05-23 MED ORDER — ROSUVASTATIN CALCIUM 20 MG PO TABS
20.0000 mg | ORAL_TABLET | Freq: Every day | ORAL | 4 refills | Status: DC
Start: 2021-05-23 — End: 2021-08-20

## 2021-06-27 ENCOUNTER — Other Ambulatory Visit: Payer: Self-pay | Admitting: Physician Assistant

## 2021-07-24 ENCOUNTER — Ambulatory Visit (INDEPENDENT_AMBULATORY_CARE_PROVIDER_SITE_OTHER): Payer: Self-pay | Admitting: Gastroenterology

## 2021-07-24 ENCOUNTER — Other Ambulatory Visit: Payer: Self-pay

## 2021-07-24 ENCOUNTER — Encounter: Payer: Self-pay | Admitting: Gastroenterology

## 2021-07-24 VITALS — BP 112/72 | HR 61 | Temp 97.7°F | Ht 73.0 in | Wt 222.6 lb

## 2021-07-24 DIAGNOSIS — R109 Unspecified abdominal pain: Secondary | ICD-10-CM

## 2021-07-24 DIAGNOSIS — K219 Gastro-esophageal reflux disease without esophagitis: Secondary | ICD-10-CM | POA: Insufficient documentation

## 2021-07-24 MED ORDER — AMITRIPTYLINE HCL 25 MG PO TABS: 25.0000 mg | ORAL_TABLET | Freq: Every day | ORAL | 3 refills | Status: DC

## 2021-07-24 NOTE — Patient Instructions (Signed)
I have increased amitriptyline to 25 mg each evening. Let me know how this works!  We will see you in 6 months!  I enjoyed seeing you again today! As you know, I value our relationship and want to provide genuine, compassionate, and quality care. I welcome your feedback. If you receive a survey regarding your visit,  I greatly appreciate you taking time to fill this out. See you next time!  Gelene Mink, PhD, ANP-BC [facility]

## 2021-07-24 NOTE — Progress Notes (Signed)
Referring Provider: Jacquelin Hawking, PA-C Primary Care Physician:  Jacquelin Hawking, PA-C Primary GI: Dr. Jena Gauss   Chief Complaint  Patient presents with   Abdominal Pain    LUQ, occ but not as often    HPI:   KYLAR LEONHARDT is a 35 y.o. male presenting today with a history of chronic LUQ pain, s/p EGD with normal esophagus, small hiatal hernia, otherwise normal. 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 He was referred to Urology. Underwent biopsy with normal tissue. Upcoming surveillance CT per Urology. Started on amitriptyline in April 2022 at 10 mg and have increased this to 20 mg.    Abdominal pain much less frequent. Now a few times a week. Better than before. No obvious precipitating factors. Unable to pinpoint a pattern. No nausea. No GERD flares. Taking Nexium once daily, which works very well for him. No N/V. No overt GI bleeding. Overall improved but interested in trying slightly higher dosage.   Past Medical History:  Diagnosis Date   Anxiety    HTN (hypertension)    Substance abuse (HCC)    remote past    Past Surgical History:  Procedure Laterality Date   CYSTOSCOPY WITH BIOPSY N/A 01/25/2021   Procedure: CYSTOSCOPY WITH BIOPSY;  Surgeon: Malen Gauze, MD;  Location: [facility];  Service: Urology;  Laterality: N/A;   CYSTOSCOPY WITH FULGERATION N/A 01/25/2021   Procedure: CYSTOSCOPY WITH FULGERATION;  Surgeon: Malen Gauze, MD;  Location: [facility];  Service: Urology;  Laterality: N/A;   ESOPHAGOGASTRODUODENOSCOPY (EGD) WITH PROPOFOL N/A 10/05/2020   normal esophagus, small hiatal hernia, otherwise normal   WISDOM TOOTH EXTRACTION      Current Outpatient Medications  Medication Sig Dispense Refill   amitriptyline (ELAVIL) 10 MG tablet Take 2 tablets (20 mg total) by mouth at bedtime. 60  tablet 5   buprenorphine (SUBUTEX) 8 MG SUBL SL tablet Place 8 mg under the tongue daily.      esomeprazole (NEXIUM) 40 MG capsule TAKE 1 CAPSULE BY MOUTH ONCE DAILY BEFORE BREAKFAST 30 capsule 5   ibuprofen (ADVIL) 200 MG tablet Take 400 mg by mouth every 6 (six) hours as needed.     metoprolol tartrate (LOPRESSOR) 100 MG tablet Take 1 tablet by mouth twice daily 60 tablet 3   rosuvastatin (CRESTOR) 20 MG tablet Take 1 tablet (20 mg total) by mouth daily. 30 tablet 4   No current facility-administered medications for this visit.    Allergies as of 07/24/2021 - Review Complete 07/24/2021  Allergen Reaction Noted   Augmentin [amoxicillin-pot clavulanate] Nausea And Vomiting 06/13/2020    Family History  Problem Relation Age of Onset   Asthma Brother    Asthma Brother    Colon cancer Neg Hx    Colon polyps Neg Hx     Social History   Socioeconomic History   Marital status: Single    Spouse name: Not on file   Number of children: Not on file   Years of education: Not on file   Highest education level: Not on file  Occupational History   Occupation: substitute teacher  Tobacco Use   Smoking status: Some Days   Smokeless tobacco: Never   Tobacco comments:    Occ vapes  Vaping Use   Vaping Use: Some days  Substance and Sexual Activity   Alcohol use: Not Currently   Drug  use: Not Currently    Types: Opium    Comment: hx of opoid use (percocet/hydrocodone) last used 2017   Sexual activity: Not on file  Other Topics Concern   Not on file  Social History Narrative   Not on file   Social Determinants of Health   Financial Resource Strain: Not on file  Food Insecurity: Not on file  Transportation Needs: Not on file  Physical Activity: Not on file  Stress: Not on file  Social Connections: Not on file    Review of Systems: Gen: Denies fever, chills, anorexia. Denies fatigue, weakness, weight loss.  CV: Denies chest pain, palpitations, syncope, peripheral edema, and  claudication. Resp: Denies dyspnea at rest, cough, wheezing, coughing up blood, and pleurisy. GI: see HPI Derm: Denies rash, itching, dry skin Psych: Denies depression, anxiety, memory loss, confusion. No homicidal or suicidal ideation.  Heme: Denies bruising, bleeding, and enlarged lymph nodes.  Physical Exam: BP 112/72   Pulse 61   Temp 97.7 F (36.5 C) (Temporal)   Ht 6\' 1"  (1.854 m)   Wt 222 lb 9.6 oz (101 kg)   BMI 29.37 kg/m  General:   Alert and oriented. No distress noted. Pleasant and cooperative.  Head:  Normocephalic and atraumatic. Eyes:  Conjuctiva clear without scleral icterus. Mouth:  mask in place Abdomen:  +BS, soft, non-tender and non-distended. No rebound or guarding. No HSM or masses noted. Msk:  Symmetrical without gross deformities. Normal posture. Extremities:  Without edema. Neurologic:  Alert and  oriented x4 Psych:  Alert and cooperative. Normal mood and affect.  ASSESSMENT/PLAN: WESTEN DININO is a 35 y.o. male presenting today with a history of chronic localized LUQ pain, s/p EGD with normal esophagus, small hiatal hernia, otherwise normal. 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 He was referred to Urology. Underwent biopsy with normal tissue. Surveillance CT upcoming in Aug 2022.  Abdominal wall pain improvement noted in amitriptyline 20 mg each evening but still with intermittent discomfort although much improved. Will trial 25 mg each evening. If he continues to have persistent pain, will refer to tertiary facility. Encouragingly, he has noted improvement thus far.   Return in follow-up in 6 months.   Sep 2022, PhD, ANP-BC [facility]

## 2021-07-30 ENCOUNTER — Ambulatory Visit (HOSPITAL_COMMUNITY): Admission: RE | Admit: 2021-07-30 | Payer: Self-pay | Source: Ambulatory Visit

## 2021-08-08 ENCOUNTER — Ambulatory Visit: Payer: Self-pay | Admitting: Urology

## 2021-08-20 ENCOUNTER — Other Ambulatory Visit: Payer: Self-pay | Admitting: Physician Assistant

## 2021-08-20 MED ORDER — ROSUVASTATIN CALCIUM 20 MG PO TABS: 20.0000 mg | ORAL_TABLET | Freq: Every day | ORAL | 1 refills | Status: DC

## 2021-08-29 ENCOUNTER — Other Ambulatory Visit: Payer: Self-pay | Admitting: Physician Assistant

## 2021-08-29 ENCOUNTER — Other Ambulatory Visit: Payer: Self-pay | Admitting: Gastroenterology

## 2021-08-29 DIAGNOSIS — E785 Hyperlipidemia, unspecified: Secondary | ICD-10-CM

## 2021-08-29 DIAGNOSIS — I10 Essential (primary) hypertension: Secondary | ICD-10-CM

## 2021-09-04 ENCOUNTER — Telehealth: Payer: Self-pay | Admitting: Urology

## 2021-09-04 ENCOUNTER — Other Ambulatory Visit: Payer: Self-pay

## 2021-09-11 ENCOUNTER — Ambulatory Visit: Payer: Self-pay | Admitting: Physician Assistant

## 2021-09-18 ENCOUNTER — Telehealth: Payer: Self-pay | Admitting: Urology

## 2021-10-02 ENCOUNTER — Other Ambulatory Visit: Payer: Self-pay

## 2021-10-02 ENCOUNTER — Ambulatory Visit (HOSPITAL_COMMUNITY)
Admission: RE | Admit: 2021-10-02 | Discharge: 2021-10-02 | Disposition: A | Discharge status: Home / Self Care | Payer: Self-pay | Source: Ambulatory Visit | Attending: Urology | Admitting: Urology

## 2021-10-02 ENCOUNTER — Encounter (HOSPITAL_COMMUNITY): Payer: Self-pay

## 2021-10-02 DIAGNOSIS — N3289 Other specified disorders of bladder: Secondary | ICD-10-CM | POA: Insufficient documentation

## 2021-10-02 LAB — POCT I-STAT CREATININE: Creatinine, Ser: 0.7 mg/dL (ref 0.61–1.24)

## 2021-10-02 MED ORDER — IOHEXOL 300 MG/ML  SOLN
100.0000 mL | Freq: Once | INTRAMUSCULAR | Status: AC | PRN
  Administered 2021-10-02: 100 mL via INTRAVENOUS

## 2021-10-16 ENCOUNTER — Ambulatory Visit (INDEPENDENT_AMBULATORY_CARE_PROVIDER_SITE_OTHER): Payer: Self-pay | Admitting: Urology

## 2021-10-16 ENCOUNTER — Encounter: Payer: Self-pay | Admitting: Urology

## 2021-10-16 ENCOUNTER — Other Ambulatory Visit: Payer: Self-pay

## 2021-10-16 DIAGNOSIS — N3289 Other specified disorders of bladder: Secondary | ICD-10-CM

## 2021-10-16 NOTE — Progress Notes (Signed)
10/16/2021 1:29 PM   Christopher Castro 05-04-1986 [id number]  Referring provider: Jacquelin Hawking, PA-C [address] [place],  [place] [address]  Patient location: home Physician location: office I connected with  Christopher Castro on 10/16/21 by a video enabled telemedicine application and verified that I am speaking with the correct person using two identifiers.   I discussed the limitations of evaluation and management by telemedicine. The patient expressed understanding and agreed to proceed.    Followup bladder mass  HPI: Christopher Castro is a 35yo here for followup for a bladder mass and urinary frequency. 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 No abdominal pain. So significant LUTS. No hematuria or dysuria. No other complaints today.    PMH: Past Medical History:  Diagnosis Date   Anxiety    HTN (hypertension)    Substance abuse (HCC)    remote past    Surgical History: Past Surgical History:  Procedure Laterality Date   CYSTOSCOPY WITH BIOPSY N/A 01/25/2021   Procedure: CYSTOSCOPY WITH BIOPSY;  Surgeon: Malen Gauze, MD;  Location: [facility];  Service: Urology;  Laterality: N/A;   CYSTOSCOPY WITH FULGERATION N/A 01/25/2021   Procedure: CYSTOSCOPY WITH FULGERATION;  Surgeon: Malen Gauze, MD;  Location: [facility];  Service: Urology;  Laterality: N/A;   ESOPHAGOGASTRODUODENOSCOPY (EGD) WITH PROPOFOL N/A 10/05/2020   normal esophagus, small hiatal hernia, otherwise normal   WISDOM TOOTH EXTRACTION      Home Medications:  Allergies as of 10/16/2021       Reactions   Augmentin [amoxicillin-pot Clavulanate] Nausea And Vomiting        Medication List        Accurate as of October 16, 2021  1:29 PM. If you have any questions, ask your nurse or doctor.          amitriptyline 25 MG tablet Commonly known as: ELAVIL Take 1 tablet (25 mg total) by mouth at bedtime.   buprenorphine 8 MG Subl SL tablet Commonly known as: SUBUTEX Place 8 mg under the  tongue daily.   esomeprazole 40 MG capsule Commonly known as: NEXIUM TAKE 1 CAPSULE BY MOUTH ONCE DAILY BEFORE BREAKFAST   ibuprofen 200 MG tablet Commonly known as: ADVIL Take 400 mg by mouth every 6 (six) hours as needed.   metoprolol tartrate 100 MG tablet Commonly known as: LOPRESSOR Take 1 tablet by mouth twice daily   rosuvastatin 20 MG tablet Commonly known as: Crestor Take 1 tablet (20 mg total) by mouth daily.        Allergies:  Allergies  Allergen Reactions   Augmentin [Amoxicillin-Pot Clavulanate] Nausea And Vomiting    Family History: Family History  Problem Relation Age of Onset   Asthma Brother    Asthma Brother    Colon cancer Neg Hx    Colon polyps Neg Hx     Social History:  reports that he has been smoking. He has never used smokeless tobacco. He reports that he does not currently use alcohol. He reports that he does not currently use drugs after having used the following drugs: Opium.  ROS: All other review of systems were reviewed and are negative except what is noted above in HPI   Laboratory Data: Lab Results  Component Value Date   WBC 6.2 05/31/2020   HGB 14.1 05/31/2020   HCT 42.4 05/31/2020   MCV 88.9 05/31/2020   PLT 296 05/31/2020    Lab Results  Component Value Date   CREATININE 0.70  10/02/2021    No results found for: PSA  No results found for: TESTOSTERONE  Lab Results  Component Value Date   HGBA1C 5.5 05/31/2020    Urinalysis    Component Value Date/Time   APPEARANCEUR Clear 02/02/2021 1330   GLUCOSEU 3+ (A) 02/02/2021 1330   BILIRUBINUR Negative 02/02/2021 1330   PROTEINUR Negative 02/02/2021 1330   NITRITE Negative 02/02/2021 1330   LEUKOCYTESUR Negative 02/02/2021 1330    Lab Results  Component Value Date   LABMICR Comment 02/02/2021   WBCUA None seen 12/12/2020   LABEPIT None seen 12/12/2020   BACTERIA None seen 12/12/2020    Pertinent Imaging: CT abd/pelvis 10/03/2021: Images reviewed and  discussed with the patient  No results found for this or any previous visit.  No results found for this or any previous visit.  No results found for this or any previous visit.  No results found for this or any previous visit.  No results found for this or any previous visit.  No results found for this or any previous visit.  No results found for this or any previous visit.  No results found for this or any previous visit.   Assessment & Plan:    1. 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 I will see him back in 1 year with CT abd/pelvis   No follow-ups on file.  Nicolette Bang, MD  [facility] [place]

## 2021-11-21 ENCOUNTER — Other Ambulatory Visit: Payer: Self-pay | Admitting: Physician Assistant

## 2021-12-28 IMAGING — CT CT ABD-PELV W/ CM
2 of 4 series · 15 of 46 positions shown, 17 images · IV contrast (Omnipaque or Isovue)
Comparison: CT abdomen pelvis 03/13/2020.

CLINICAL DATA: Lower quadrant abdominal pain.

EXAM:
CT ABDOMEN AND PELVIS WITH CONTRAST
TECHNIQUE: Multidetector CT imaging of the abdomen and pelvis was performed
using the standard protocol following bolus administration of
intravenous contrast.
CONTRAST:  100mL OMNIPAQUE IOHEXOL 300 MG/ML  SOLN

[Series 2: axial st · axial · 0.79mm/px · z∈[-528,-33]mm · 12 of 113 slices shown, 14 images]
[im 9/113  soft-tissue]
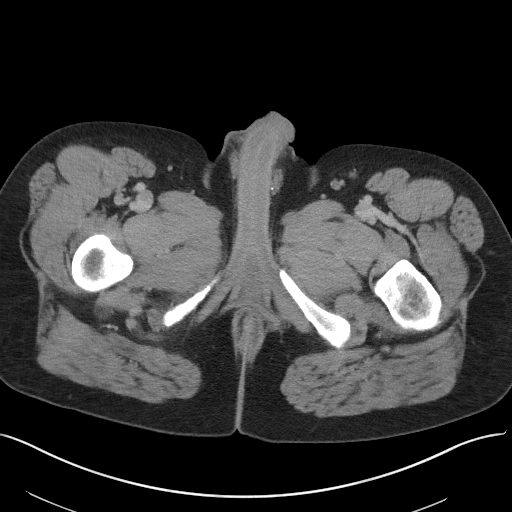
[im 9/113  bone]
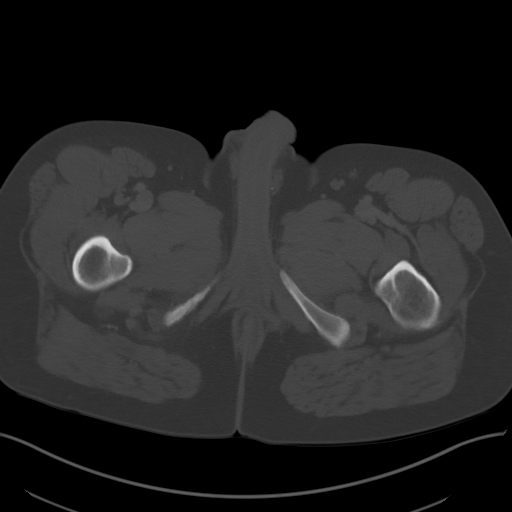
[im 18/113  soft-tissue]
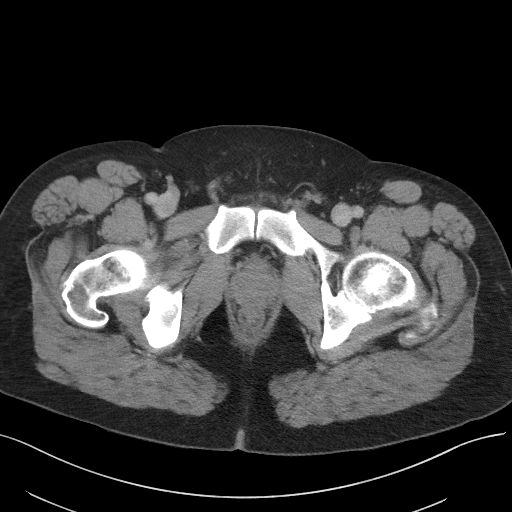
[im 27/113  soft-tissue]
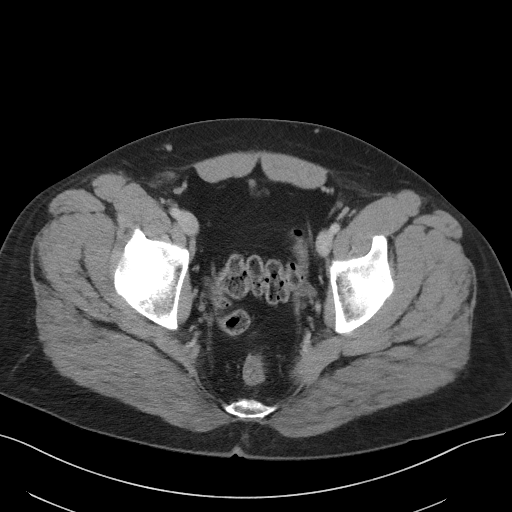
[im 36/113  soft-tissue]
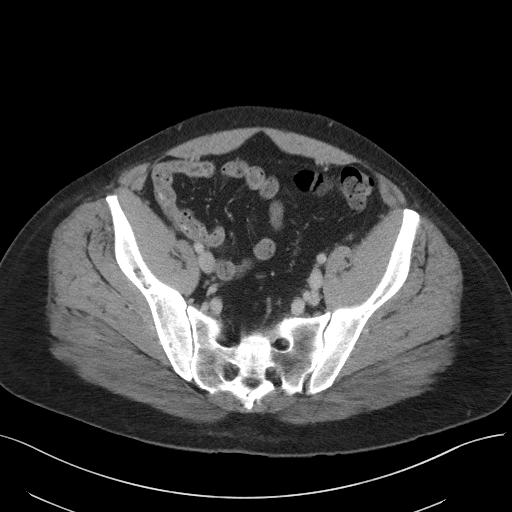
[im 45/113  soft-tissue]
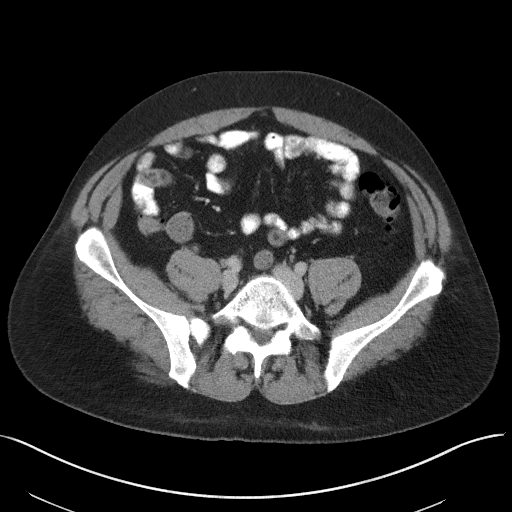
[im 54/113  soft-tissue]
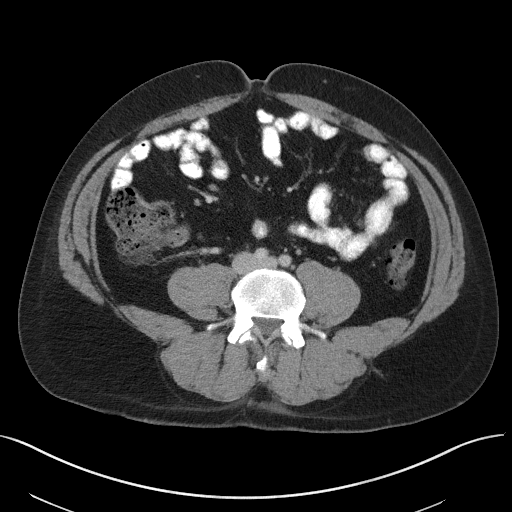
[im 63/113  soft-tissue]
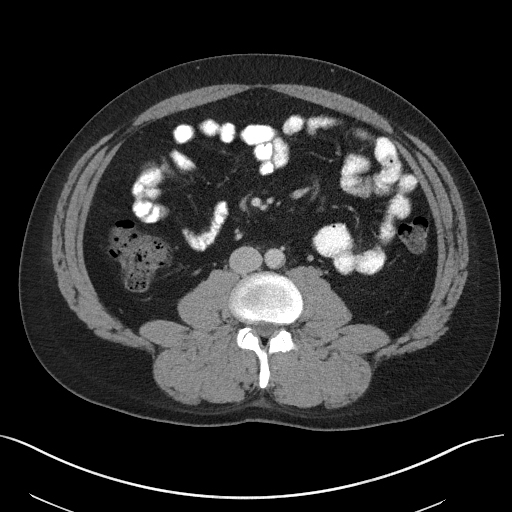
[im 72/113  soft-tissue]
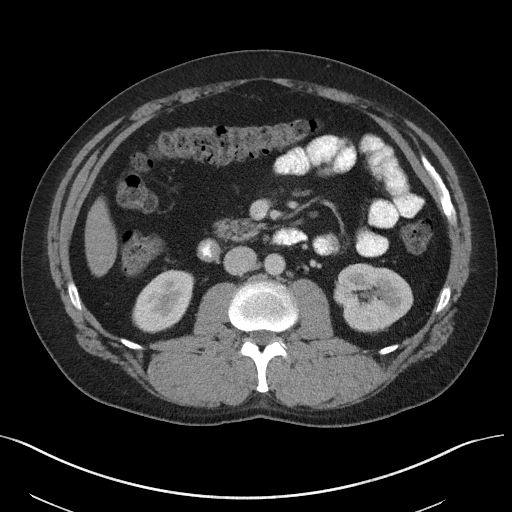
[im 81/113  soft-tissue]
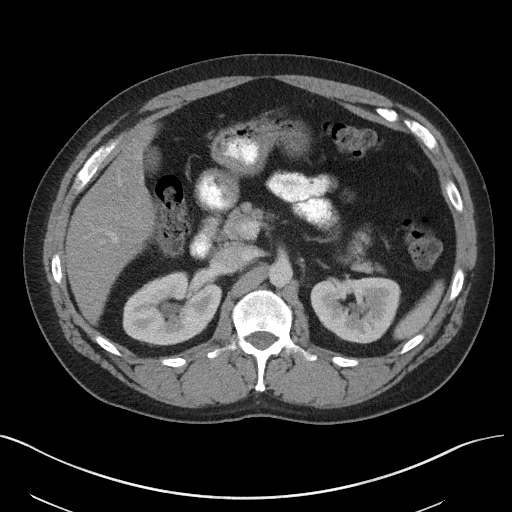
[im 81/113  bone]
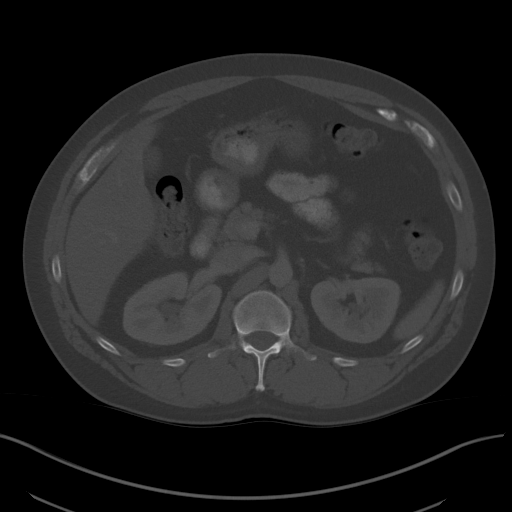
[im 90/113  soft-tissue]
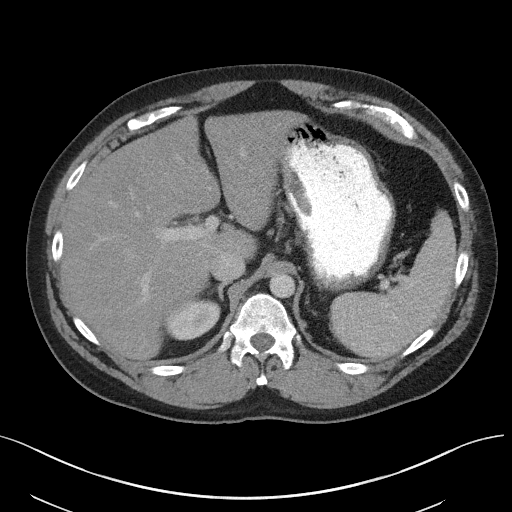
[im 99/113  soft-tissue]
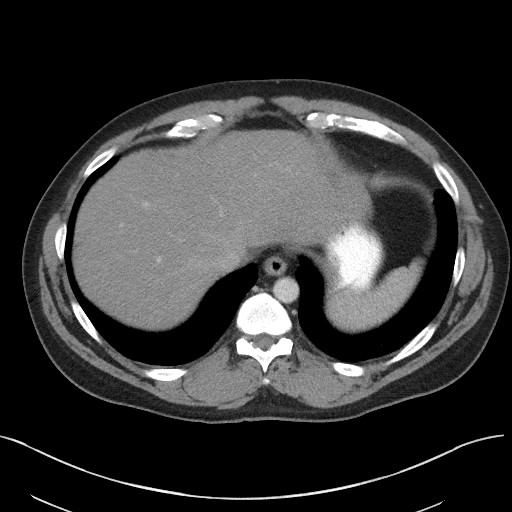
[im 108/113  soft-tissue]
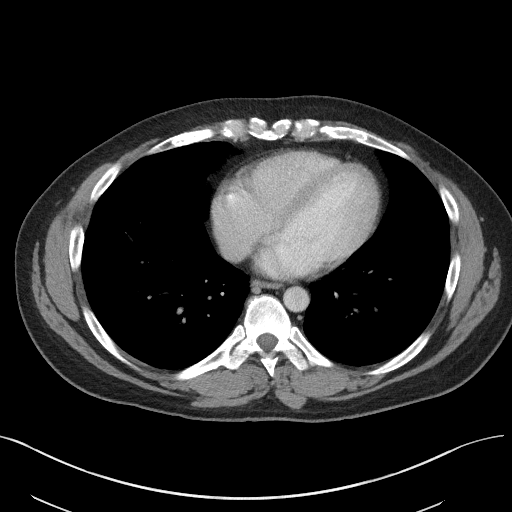

[Series 5: coronal st · coronal · 0.75mm/px · 3 of 108 slices shown]
[im 36/108  soft-tissue]
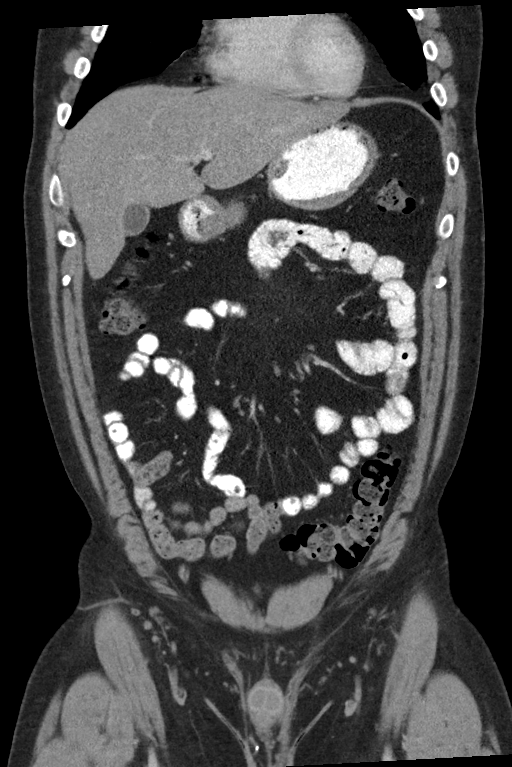
[im 48/108  soft-tissue]
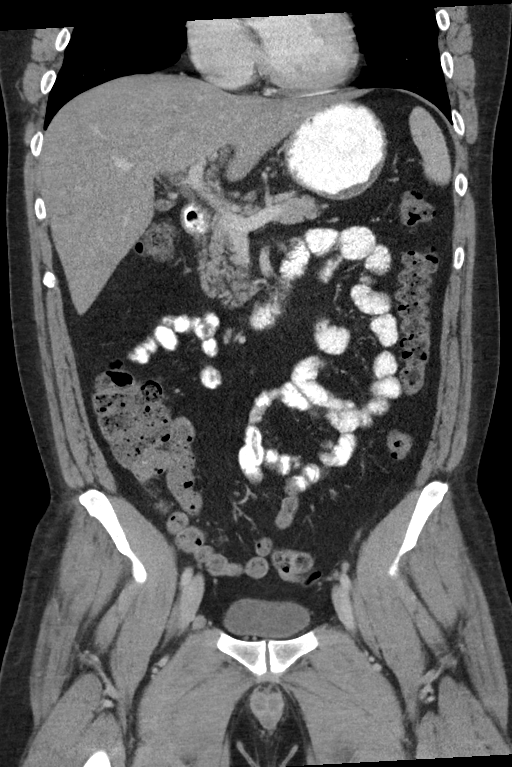
[im 60/108  soft-tissue]
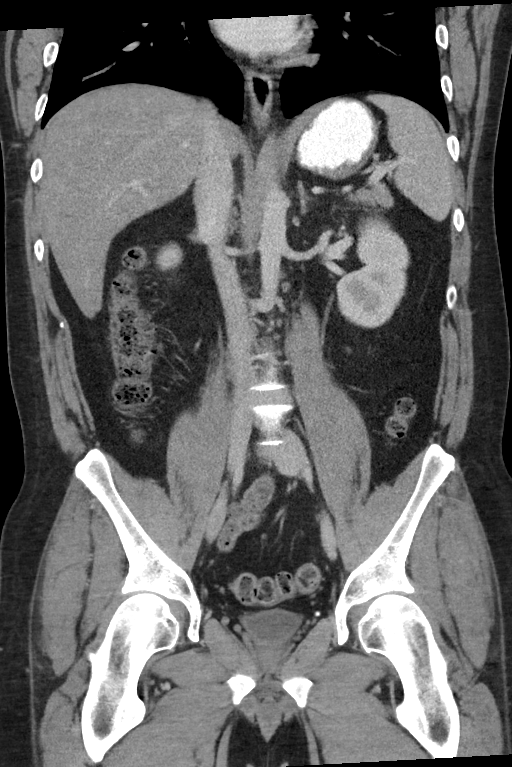

[15 of 46 positions shown; findings below may reference images not displayed]

FINDINGS: Lower chest: No acute abnormality.

Hepatobiliary: The hepatic parenchyma is diffusely mildly hypodense
compared to the splenic parenchyma consistent with fatty
infiltration. No focal liver abnormality. No gallstones, gallbladder
wall thickening, or pericholecystic fluid. No biliary dilatation.

Pancreas: No focal lesion. Normal pancreatic contour. No surrounding
inflammatory changes. No main pancreatic ductal dilatation.

Spleen: Normal in size without focal abnormality.

Adrenals/Urinary Tract: No adrenal nodule bilaterally. Bilateral
kidneys enhance symmetrically. No hydronephrosis. No hydroureter.
The urinary bladder is unremarkable. Redemonstration of a 2mm
calcification within a midline supravesicular diverticulum.

Stomach/Bowel: PO contrast reaches the mid small bowel. Stomach is
within normal limits. No evidence of bowel wall thickening or
dilatation. Appendix appears normal.

Vascular/Lymphatic: No abdominal aorta or iliac aneurysm. Mild
atherosclerotic plaque of the aorta and its branches. No abdominal,
pelvic, or inguinal lymphadenopathy.

Reproductive: Prostate is unremarkable.

Other: No intraperitoneal free fluid. No intraperitoneal free gas.
No organized fluid collection.

Musculoskeletal:

No abdominal wall hernia or abnormality

No suspicious lytic or blastic osseous lesions. No acute displaced
fracture. Multilevel degenerative changes of the spine.
IMPRESSION: 1. Persistent 2mm calcification within a midline supravesicular
diverticulum. Finding could represent a urachal carcinoma versus a
calcified stone within a diverticulum. Recommend urologic
consultation.
2. Possible mild hepatic steatosis.

These results will be called to the ordering clinician or
representative by the Radiologist Assistant, and communication
documented in the PACS or [REDACTED].

## 2022-01-23 ENCOUNTER — Encounter: Payer: Self-pay | Admitting: Internal Medicine

## 2022-01-23 ENCOUNTER — Ambulatory Visit: Payer: Self-pay | Admitting: Gastroenterology

## 2022-01-23 NOTE — Progress Notes (Unsigned)
presenting today with a history of chronic LUQ pain, s/p EGD with normal esophagus, small hiatal hernia, otherwise normal. 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 He was referred to Urology. Underwent biopsy with normal tissue. Has had surevillance and mass has been stable for 2 years, consistent with urachal cyst. 1 year follow-up CT in Nov 2023 recommended through Urology.   Started on amitriptyline in April 2022 at 10 mg. This is now up to 25 mg each evening.

## 2022-09-17 NOTE — Addendum Note (Signed)
Addended by: Iris Pert on: 09/17/2022 02:54 PM   Modules accepted: Orders

## 2022-10-16 ENCOUNTER — Ambulatory Visit: Payer: Self-pay | Admitting: Urology

## 2022-10-29 ENCOUNTER — Ambulatory Visit (HOSPITAL_COMMUNITY): Admission: RE | Admit: 2022-10-29 | Payer: Self-pay | Source: Ambulatory Visit

## 2022-11-01 ENCOUNTER — Telehealth: Payer: Self-pay

## 2022-11-01 DIAGNOSIS — N3289 Other specified disorders of bladder: Secondary | ICD-10-CM

## 2022-11-01 NOTE — Telephone Encounter (Signed)
Patient states that he canceled his CT due to not feeling well.  CT re-ordered and r/s patient's apt for 3 weeks out.

## 2022-11-06 ENCOUNTER — Ambulatory Visit: Payer: Self-pay | Admitting: Urology

## 2022-11-08 IMAGING — CT CT ABD-PEL WO/W CM
2 of 4 series · 16 of 46 positions shown, 18 images · IV contrast (Omnipaque or Isovue)
Comparison: 11/21/2020

CLINICAL DATA: Intermittent left upper quadrant pain, recent
bladder cyst biopsy

EXAM:
CT ABDOMEN AND PELVIS WITH CONTRAST
TECHNIQUE: Multidetector CT imaging of the abdomen and pelvis was performed
using the standard protocol following bolus administration of
intravenous contrast.
CONTRAST:  100mL OMNIPAQUE IOHEXOL 300 MG/ML  SOLN

[Series 2: axial st · axial · 0.93mm/px · z∈[+806,+1271]mm · 13 of 101 slices shown, 15 images]
[im 4/101  soft-tissue]
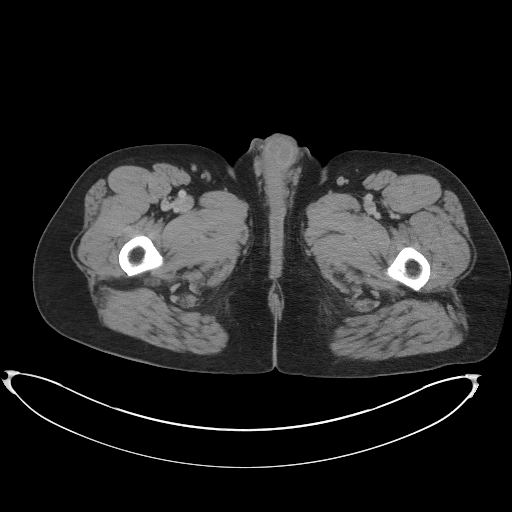
[im 4/101  bone]
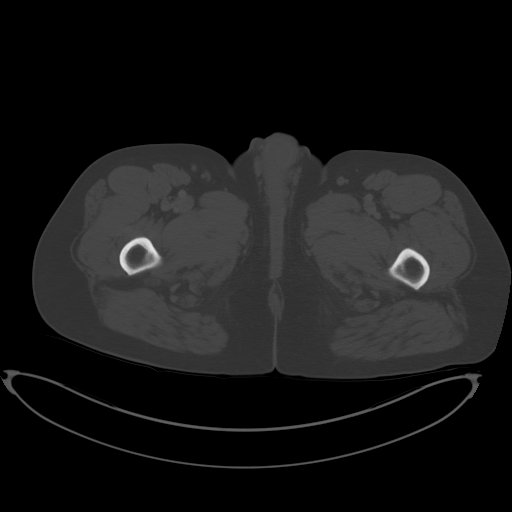
[im 12/101  soft-tissue]
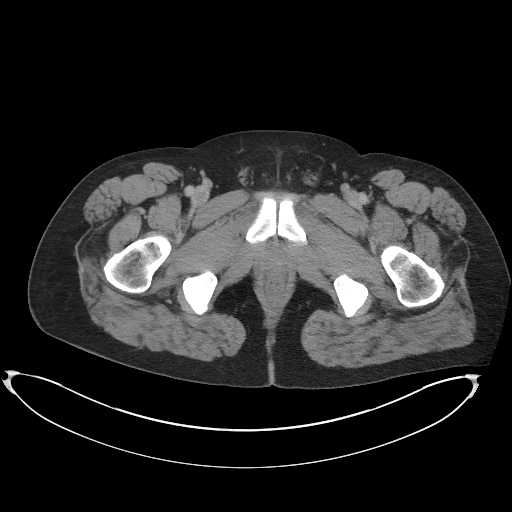
[im 20/101  soft-tissue]
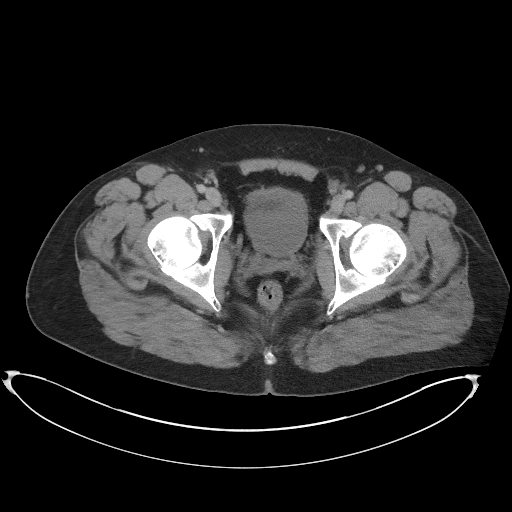
[im 27/101  soft-tissue]
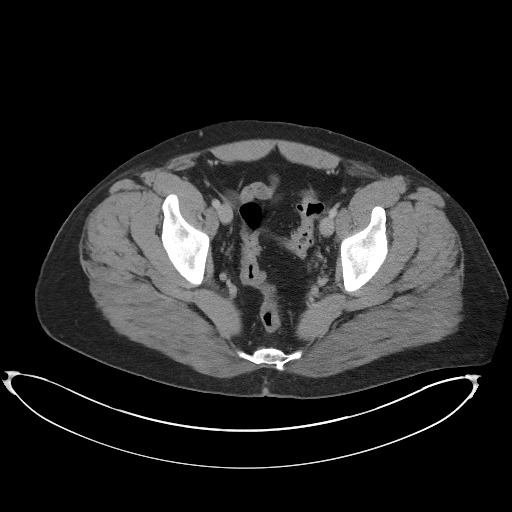
[im 35/101  soft-tissue]
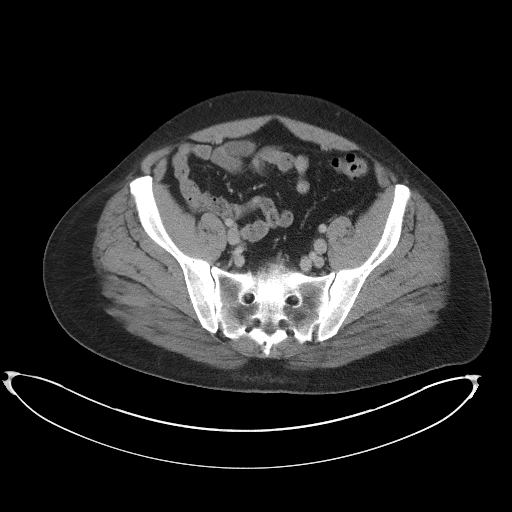
[im 43/101  soft-tissue]
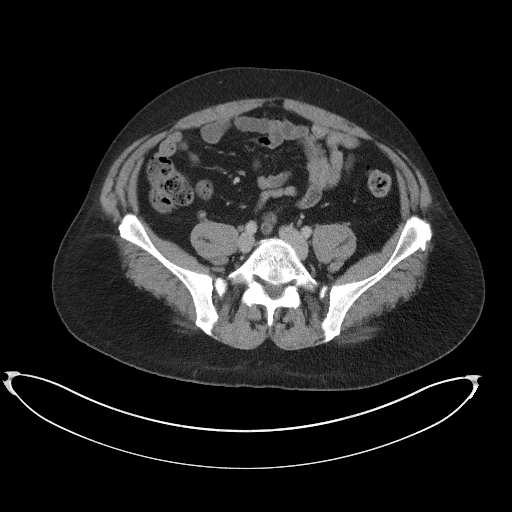
[im 51/101  soft-tissue]
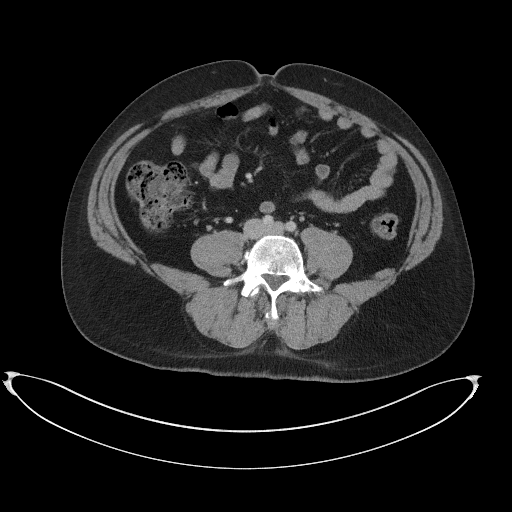
[im 58/101  soft-tissue]
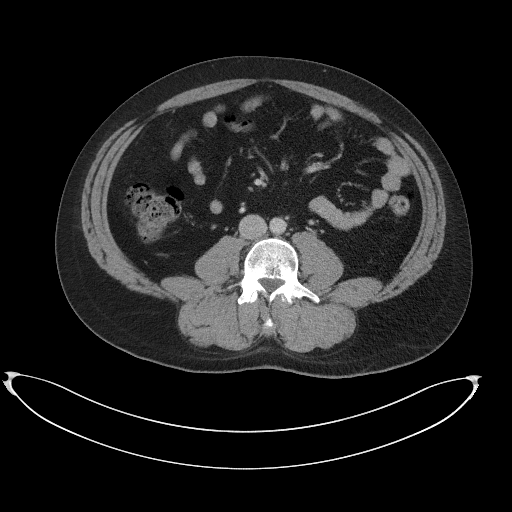
[im 66/101  soft-tissue]
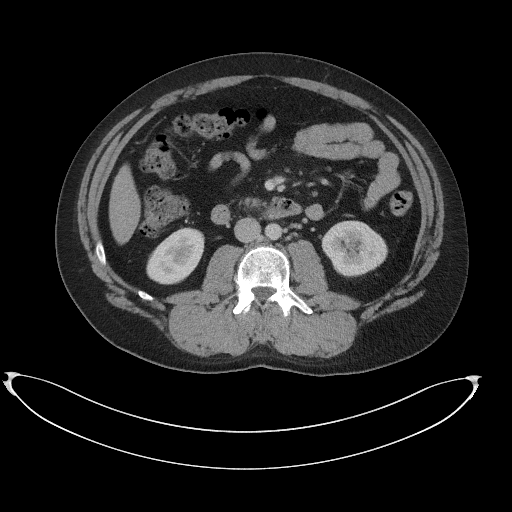
[im 66/101  bone]
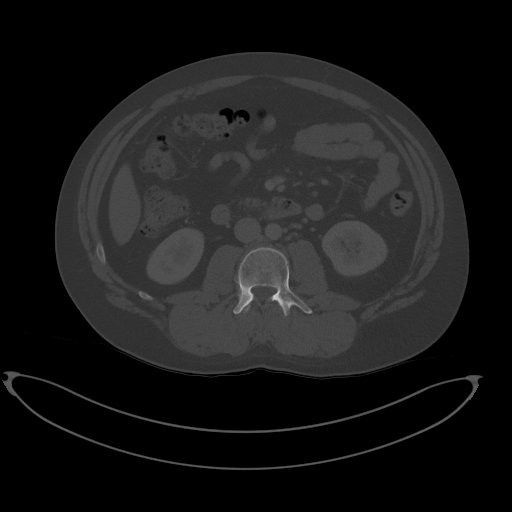
[im 74/101  soft-tissue]
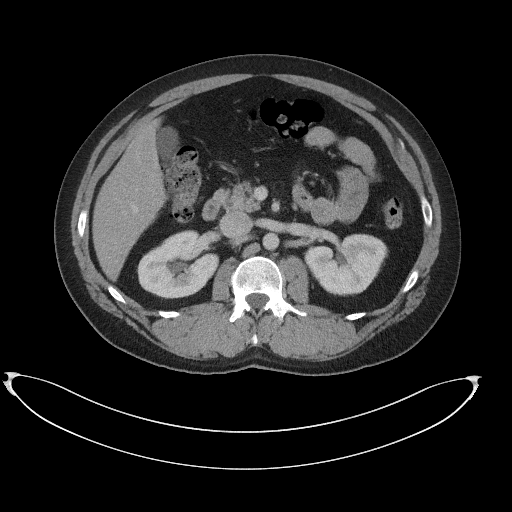
[im 81/101  soft-tissue]
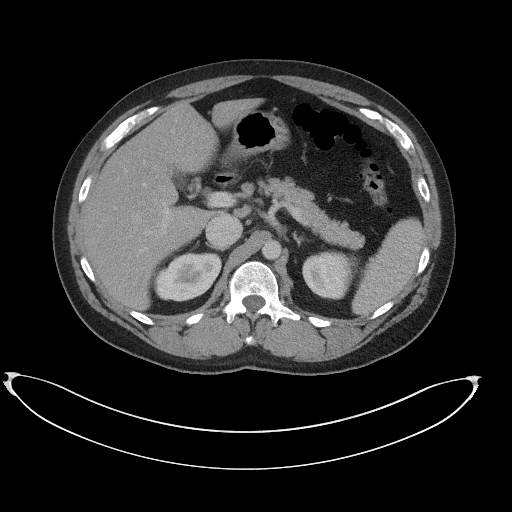
[im 89/101  soft-tissue]
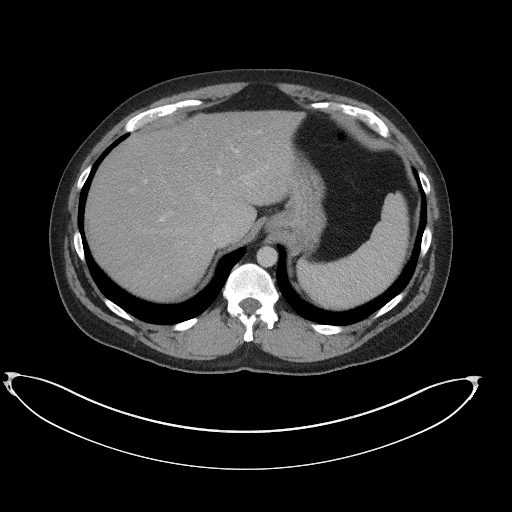
[im 97/101  soft-tissue]
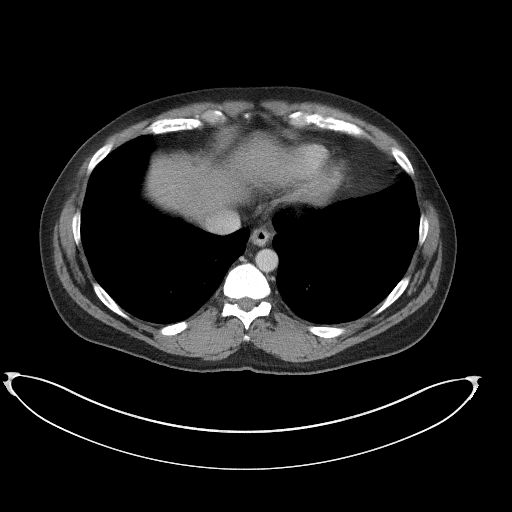

[Series 5: coronal st · coronal · 0.93mm/px · 3 of 124 slices shown]
[im 42/124  soft-tissue]
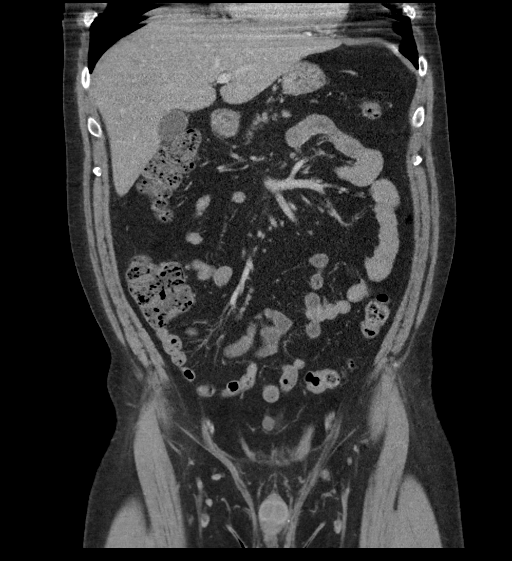
[im 55/124  soft-tissue]
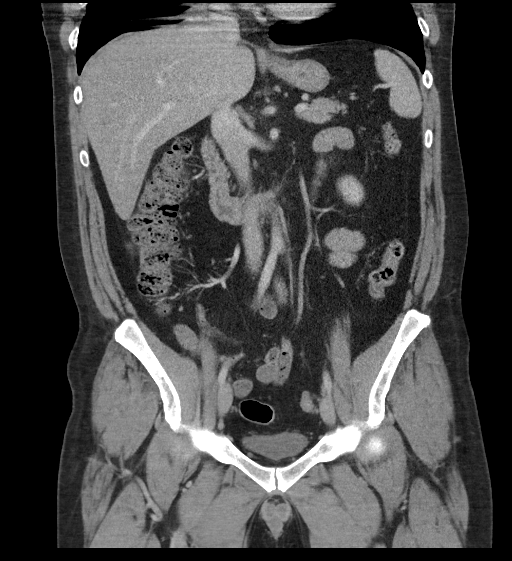
[im 69/124  soft-tissue]
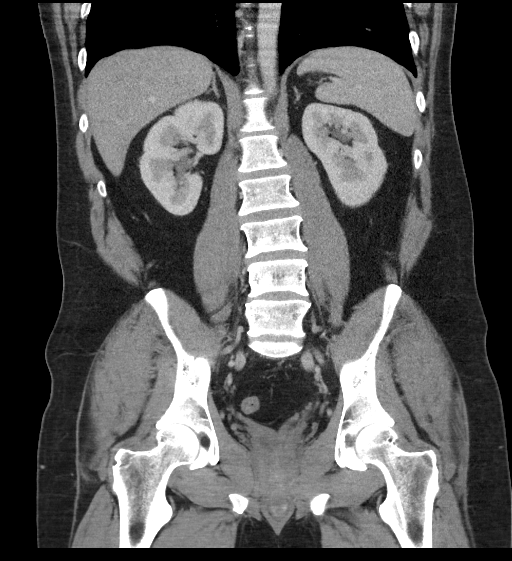

[16 of 46 positions shown; findings below may reference images not displayed]

FINDINGS: Lower chest: No acute abnormality.

Hepatobiliary: No solid liver abnormality is seen. No gallstones,
gallbladder wall thickening, or biliary dilatation.

Pancreas: Unremarkable. No pancreatic ductal dilatation or
surrounding inflammatory changes.

Spleen: Normal in size without significant abnormality.

Adrenals/Urinary Tract: Adrenal glands are unremarkable. Kidneys are
normal, without renal calculi, solid lesion, or hydronephrosis.
Unchanged cystic lesion containing a tiny calcification at the
anterior aspect of the bladder dome, measuring 1.1 cm (series 2,
image 79).

Stomach/Bowel: Stomach is within normal limits. Appendix appears
normal. No evidence of bowel wall thickening, distention, or
inflammatory changes. Descending and sigmoid diverticulosis.

Vascular/Lymphatic: No significant vascular findings are present. No
enlarged abdominal or pelvic lymph nodes.

Reproductive: No mass or other significant abnormality.

Other: No abdominal wall hernia or abnormality. No abdominopelvic
ascites.

Musculoskeletal: No acute or significant osseous findings.
IMPRESSION: 1. No CT findings of the abdomen or pelvis to explain left upper
quadrant pain.
2. Descending and sigmoid diverticulosis without evidence of acute
diverticulitis.
3. Unchanged cystic lesion containing a tiny calcification at the
anterior aspect of the bladder dome, measuring 1.1 cm. This is
consistent with a small bladder lumen or urachal diverticulum. By
report, this was recently biopsied.

## 2022-12-04 ENCOUNTER — Ambulatory Visit: Payer: Self-pay | Admitting: Urology

## 2023-01-30 ENCOUNTER — Ambulatory Visit (HOSPITAL_COMMUNITY): Admission: RE | Admit: 2023-01-30 | Payer: Self-pay | Source: Ambulatory Visit

## 2023-02-05 ENCOUNTER — Ambulatory Visit: Payer: Self-pay | Admitting: Urology

## 2023-02-07 ENCOUNTER — Ambulatory Visit (HOSPITAL_COMMUNITY): Admission: RE | Admit: 2023-02-07 | Payer: Self-pay | Source: Ambulatory Visit

## 2023-02-07 ENCOUNTER — Encounter (HOSPITAL_COMMUNITY): Payer: Self-pay

## 2023-02-14 ENCOUNTER — Ambulatory Visit: Payer: Self-pay | Admitting: Urology

## 2024-06-23 ENCOUNTER — Ambulatory Visit: Admitting: Urology

## 2024-06-23 VITALS — BP 124/75 | HR 79

## 2024-06-23 DIAGNOSIS — Q644 Malformation of urachus: Secondary | ICD-10-CM

## 2024-06-23 DIAGNOSIS — R339 Retention of urine, unspecified: Secondary | ICD-10-CM

## 2024-06-23 DIAGNOSIS — N3289 Other specified disorders of bladder: Secondary | ICD-10-CM

## 2024-06-23 LAB — URINALYSIS, ROUTINE W REFLEX MICROSCOPIC
Bilirubin, UA: NEGATIVE
Glucose, UA: NEGATIVE
Ketones, UA: NEGATIVE
Leukocytes,UA: NEGATIVE
Nitrite, UA: NEGATIVE
Protein,UA: NEGATIVE
RBC, UA: NEGATIVE
Specific Gravity, UA: 1.025 (ref 1.005–1.030)
Urobilinogen, Ur: 0.2 mg/dL (ref 0.2–1.0)
pH, UA: 6 (ref 5.0–7.5)

## 2024-06-23 LAB — BLADDER SCAN AMB NON-IMAGING: Scan Result: 188

## 2024-06-23 MED ORDER — ALFUZOSIN HCL ER 10 MG PO TB24: 10.0000 mg | ORAL_TABLET | Freq: Every day | ORAL | 11 refills | Status: AC

## 2024-06-23 NOTE — Progress Notes (Signed)
 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 For the past 2-3 months he has noted difficulty emptying his bladder, starting/stopping of his stream, straining to urinate. Urine stream is fair. No hematuria or dysuria. Nocturia 0x He has falres where he has difficulty emptying his bladder and then the LUTS resolve. PVR 188cc   PMH: Past Medical History:  Diagnosis Date   Anxiety    HTN (hypertension)    Substance abuse (HCC)    remote past    Surgical History: Past Surgical History:  Procedure Laterality Date   CYSTOSCOPY WITH BIOPSY N/A 01/25/2021   Procedure: CYSTOSCOPY WITH BIOPSY;  Surgeon: Sherrilee Belvie CROME, MD;  Location: [facility];  Service: Urology;  Laterality: N/A;   CYSTOSCOPY WITH FULGERATION N/A 01/25/2021   Procedure: CYSTOSCOPY WITH FULGERATION;  Surgeon: Sherrilee Belvie CROME, MD;  Location: [facility];  Service: Urology;  Laterality: N/A;   ESOPHAGOGASTRODUODENOSCOPY (EGD) WITH PROPOFOL  N/A 10/05/2020   normal esophagus, small hiatal hernia, otherwise normal   WISDOM TOOTH EXTRACTION      Home Medications:  Allergies as of 06/23/2024       Reactions   Augmentin  [amoxicillin -pot Clavulanate] Nausea And Vomiting        Medication List        Accurate as of June 23, 2024  2:36 PM. If you have any questions, ask your nurse or doctor.          STOP taking these medications    amitriptyline  25 MG tablet Commonly known as: ELAVIL  Stopped by: Belvie Sherrilee   esomeprazole  40 MG capsule Commonly known as: NEXIUM  Stopped by: Belvie Sherrilee   ibuprofen 200 MG tablet Commonly known as: ADVIL Stopped by: Belvie Sherrilee   rosuvastatin  20 MG tablet Commonly known as: CRESTOR  Stopped by: Belvie Sherrilee       TAKE these medications    atomoxetine 40 MG  capsule Commonly known as: STRATTERA TAKE 1 CAPSULE BY MOUTH ONCE DAILY FOR 14 DAYS   buprenorphine 8 MG Subl SL tablet Commonly known as: SUBUTEX Place 8 mg under the tongue daily.   escitalopram 10 MG tablet Commonly known as: LEXAPRO Take 10 mg by mouth daily.   metoprolol  tartrate 100 MG tablet Commonly known as: LOPRESSOR  Take 1 tablet by mouth twice daily        Allergies:  Allergies  Allergen Reactions   Augmentin  [Amoxicillin -Pot Clavulanate] Nausea And Vomiting    Family History: Family History  Problem Relation Age of Onset   Asthma Brother    Asthma Brother    Colon cancer Neg Hx    Colon polyps Neg Hx     Social History:  reports that he has been smoking. He has never used smokeless tobacco. He reports that he does not currently use alcohol. He reports that he does not currently use drugs after having used the following drugs: Opium.  ROS: All other review of systems were reviewed and are negative except what is noted above in HPI  Physical Exam: BP 124/75   Pulse 79   Constitutional:  Alert and oriented, No acute distress. HEENT: [place] AT, moist mucus membranes.  Trachea midline, no masses. Cardiovascular: No clubbing, cyanosis, or edema. Respiratory: Normal respiratory effort, no increased work of breathing. GI: Abdomen is soft, nontender, nondistended, no abdominal masses GU:  No CVA tenderness.  Lymph: No cervical or inguinal lymphadenopathy. Skin: No rashes, bruises or suspicious lesions. Neurologic: Grossly intact, no focal deficits, moving all 4 extremities. Psychiatric: Normal mood and affect.  Laboratory Data: Lab Results  Component Value Date   WBC 6.2 05/31/2020   HGB 14.1 05/31/2020   HCT 42.4 05/31/2020   MCV 88.9 05/31/2020   PLT 296 05/31/2020    Lab Results  Component Value Date   CREATININE 0.70 10/02/2021    No results found for: PSA  No results found for: TESTOSTERONE  Lab Results  Component Value Date   HGBA1C  5.5 05/31/2020    Urinalysis    Component Value Date/Time   APPEARANCEUR Clear 02/02/2021 1330   GLUCOSEU 3+ (A) 02/02/2021 1330   BILIRUBINUR Negative 02/02/2021 1330   PROTEINUR Negative 02/02/2021 1330   NITRITE Negative 02/02/2021 1330   LEUKOCYTESUR Negative 02/02/2021 1330    Lab Results  Component Value Date   LABMICR Comment 02/02/2021   WBCUA None seen 12/12/2020   LABEPIT None seen 12/12/2020   BACTERIA None seen 12/12/2020    Pertinent Imaging:  No results found for this or any previous visit.  No results found for this or any previous visit.  No results found for this or any previous visit.  No results found for this or any previous visit.  No results found for this or any previous visit.  No results found for this or any previous visit.  No results found for this or any previous visit.  No results found for this or any previous visit.   Assessment & Plan:    1. Urachal mass CT abd/pelvis w/wo contrast - Urinalysis, Routine w reflex microscopic - BLADDER SCAN AMB NON-IMAGING  2. Incomplete emptying of bladder -we will trial uroxatral  10mg  qhs   No follow-ups on file.  Belvie Clara, MD  [facility] [place]

## 2024-06-29 ENCOUNTER — Encounter: Payer: Self-pay | Admitting: Urology

## 2024-07-05 ENCOUNTER — Ambulatory Visit (HOSPITAL_COMMUNITY)
Admission: RE | Admit: 2024-07-05 | Discharge: 2024-07-05 | Disposition: A | Discharge status: Home / Self Care | Source: Ambulatory Visit | Attending: Urology | Admitting: Urology

## 2024-07-05 DIAGNOSIS — Q644 Malformation of urachus: Secondary | ICD-10-CM | POA: Diagnosis present

## 2024-07-05 MED ORDER — IOHEXOL 300 MG/ML  SOLN
100.0000 mL | Freq: Once | INTRAMUSCULAR | Status: AC | PRN
  Administered 2024-07-05: 100 mL via INTRAVENOUS

## 2024-07-20 ENCOUNTER — Ambulatory Visit: Payer: Self-pay | Admitting: Urology

## 2024-09-01 ENCOUNTER — Ambulatory Visit: Admitting: Urology

## 2024-09-01 VITALS — BP 133/75 | HR 90

## 2024-09-01 DIAGNOSIS — R3916 Straining to void: Secondary | ICD-10-CM

## 2024-09-01 DIAGNOSIS — N3289 Other specified disorders of bladder: Secondary | ICD-10-CM

## 2024-09-01 DIAGNOSIS — R3912 Poor urinary stream: Secondary | ICD-10-CM

## 2024-09-01 DIAGNOSIS — Q644 Malformation of urachus: Secondary | ICD-10-CM

## 2024-09-01 LAB — URINALYSIS, ROUTINE W REFLEX MICROSCOPIC
Bilirubin, UA: NEGATIVE
Glucose, UA: NEGATIVE
Ketones, UA: NEGATIVE
Leukocytes,UA: NEGATIVE
Nitrite, UA: NEGATIVE
Protein,UA: NEGATIVE
RBC, UA: NEGATIVE
Specific Gravity, UA: 1.03 (ref 1.005–1.030)
Urobilinogen, Ur: 1 mg/dL (ref 0.2–1.0)
pH, UA: 5.5 (ref 5.0–7.5)

## 2024-09-01 NOTE — Progress Notes (Signed)
 09/01/2024 2:29 PM   Christopher Castro 12-29-1985 [id number]  Referring provider: Latisha Rosalynn DELENA, MD No address on file  Followup urachal mass   HPI: Mr Christopher Castro is a 38yo here for followup for a urachal calcification. 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 No stone tissue mass seen.  IPSS 6 QOL 4 off of uroxatral . He has intermittent straining to urinate. He has occasional urinary hesitancy.    PMH: Past Medical History:  Diagnosis Date   Anxiety    HTN (hypertension)    Substance abuse (HCC)    remote past    Surgical History: Past Surgical History:  Procedure Laterality Date   CYSTOSCOPY WITH BIOPSY N/A 01/25/2021   Procedure: CYSTOSCOPY WITH BIOPSY;  Surgeon: Christopher Christopher CROME, MD;  Location: [facility];  Service: Urology;  Laterality: N/A;   CYSTOSCOPY WITH FULGERATION N/A 01/25/2021   Procedure: CYSTOSCOPY WITH FULGERATION;  Surgeon: Christopher Christopher CROME, MD;  Location: [facility];  Service: Urology;  Laterality: N/A;   ESOPHAGOGASTRODUODENOSCOPY (EGD) WITH PROPOFOL  N/A 10/05/2020   normal esophagus, small hiatal hernia, otherwise normal   WISDOM TOOTH EXTRACTION      Home Medications:  Allergies as of 09/01/2024       Reactions   Augmentin  [amoxicillin -pot Clavulanate] Nausea And Vomiting        Medication List        Accurate as of September 01, 2024  2:29 PM. If you have any questions, ask your nurse or doctor.          alfuzosin  10 MG 24 hr tablet Commonly known as: UROXATRAL  Take 1 tablet (10 mg total) by mouth at bedtime.   atomoxetine 40 MG capsule Commonly known as: STRATTERA TAKE 1 CAPSULE BY MOUTH ONCE DAILY FOR 14 DAYS   buprenorphine 8 MG Subl SL tablet Commonly known as: SUBUTEX Place 8 mg under the tongue daily.   escitalopram 10 MG tablet Commonly known as: LEXAPRO Take 10 mg by mouth daily.   metoprolol  tartrate 100 MG tablet Commonly known as: LOPRESSOR  Take 1 tablet by mouth twice daily         Allergies:  Allergies  Allergen Reactions   Augmentin  [Amoxicillin -Pot Clavulanate] Nausea And Vomiting    Family History: Family History  Problem Relation Age of Onset   Asthma Brother    Asthma Brother    Colon cancer Neg Hx    Colon polyps Neg Hx     Social History:  reports that he has been smoking. He has never used smokeless tobacco. He reports that he does not currently use alcohol. He reports that he does not currently use drugs after having used the following drugs: Opium.  ROS: All other review of systems were reviewed and are negative except what is noted above in HPI  Physical Exam: BP 133/75   Pulse 90   Constitutional:  Alert and oriented, No acute distress. HEENT: [place] AT, moist mucus membranes.  Trachea midline, no masses. Cardiovascular: No clubbing, cyanosis, or edema. Respiratory: Normal respiratory effort, no increased work of breathing. GI: Abdomen is soft, nontender, nondistended, no abdominal masses GU: No CVA tenderness.  Lymph: No cervical or inguinal lymphadenopathy. Skin: No rashes, bruises or suspicious lesions. Neurologic: Grossly intact, no focal deficits, moving all 4 extremities. Psychiatric: Normal mood and affect.  Laboratory Data: Lab Results  Component Value Date   WBC 6.2 05/31/2020   HGB 14.1 05/31/2020   HCT 42.4 05/31/2020   MCV 88.9  05/31/2020   PLT 296 05/31/2020    Lab Results  Component Value Date   CREATININE 0.70 10/02/2021    No results found for: PSA  No results found for: TESTOSTERONE  Lab Results  Component Value Date   HGBA1C 5.5 05/31/2020    Urinalysis    Component Value Date/Time   APPEARANCEUR Clear 06/23/2024 1435   GLUCOSEU Negative 06/23/2024 1435   BILIRUBINUR Negative 06/23/2024 1435   PROTEINUR Negative 06/23/2024 1435   NITRITE Negative 06/23/2024 1435   LEUKOCYTESUR Negative 06/23/2024 1435    Lab Results  Component Value Date   LABMICR Comment 06/23/2024   WBCUA None  seen 12/12/2020   LABEPIT None seen 12/12/2020   BACTERIA None seen 12/12/2020    Pertinent Imaging:  No results found for this or any previous visit.  No results found for this or any previous visit.  No results found for this or any previous visit.  No results found for this or any previous visit.  No results found for this or any previous visit.  No results found for this or any previous visit.  No results found for this or any previous visit.  No results found for this or any previous visit.   Assessment & Plan:    1. Bladder mass (Primary) Followup 1 year with CT - Urinalysis, Routine w reflex microscopic     No follow-ups on file.  Christopher Clara, MD  [facility] [place]

## 2024-09-07 ENCOUNTER — Encounter: Payer: Self-pay | Admitting: Urology

## 2025-09-07 ENCOUNTER — Ambulatory Visit: Admitting: Urology
